# Patient Record
Sex: Male | Born: 1980 | Race: White | Hispanic: No | Marital: Married | Smoking: Former smoker
Health system: Southern US, Community
[De-identification: ages and names within clinical notes are randomized; demographics above are authoritative.]

## PROBLEM LIST (undated history)

## (undated) DIAGNOSIS — N200 Calculus of kidney: Secondary | ICD-10-CM

## (undated) DIAGNOSIS — M51379 Other intervertebral disc degeneration, lumbosacral region without mention of lumbar back pain or lower extremity pain: Secondary | ICD-10-CM

## (undated) DIAGNOSIS — N201 Calculus of ureter: Secondary | ICD-10-CM

## (undated) DIAGNOSIS — Z87442 Personal history of urinary calculi: Secondary | ICD-10-CM

## (undated) DIAGNOSIS — M5137 Other intervertebral disc degeneration, lumbosacral region: Secondary | ICD-10-CM

## (undated) DIAGNOSIS — K219 Gastro-esophageal reflux disease without esophagitis: Secondary | ICD-10-CM

## (undated) HISTORY — PX: TONSILLECTOMY: SUR1361

## (undated) HISTORY — DX: Gastro-esophageal reflux disease without esophagitis: K21.9

## (undated) HISTORY — PX: ESOPHAGEAL DILATION: SHX303

## (undated) HISTORY — DX: Personal history of urinary calculi: Z87.442

---

## 2002-09-03 ENCOUNTER — Emergency Department (HOSPITAL_COMMUNITY): Admission: EM | Admit: 2002-09-03 | Discharge: 2002-09-03 | Payer: Self-pay | Admitting: Emergency Medicine

## 2002-09-03 ENCOUNTER — Encounter: Payer: Self-pay | Admitting: *Deleted

## 2006-11-30 ENCOUNTER — Emergency Department (HOSPITAL_COMMUNITY): Admission: EM | Admit: 2006-11-30 | Discharge: 2006-11-30 | Payer: Self-pay | Admitting: Emergency Medicine

## 2007-05-08 ENCOUNTER — Emergency Department (HOSPITAL_COMMUNITY): Admission: EM | Admit: 2007-05-08 | Discharge: 2007-05-08 | Payer: Self-pay | Admitting: Emergency Medicine

## 2008-04-13 ENCOUNTER — Emergency Department (HOSPITAL_BASED_OUTPATIENT_CLINIC_OR_DEPARTMENT_OTHER): Admission: EM | Admit: 2008-04-13 | Discharge: 2008-04-13 | Payer: Self-pay | Admitting: Emergency Medicine

## 2008-05-22 ENCOUNTER — Emergency Department (HOSPITAL_BASED_OUTPATIENT_CLINIC_OR_DEPARTMENT_OTHER): Admission: EM | Admit: 2008-05-22 | Discharge: 2008-05-22 | Payer: Self-pay | Admitting: Emergency Medicine

## 2008-08-16 ENCOUNTER — Emergency Department (HOSPITAL_BASED_OUTPATIENT_CLINIC_OR_DEPARTMENT_OTHER): Admission: EM | Admit: 2008-08-16 | Discharge: 2008-08-16 | Payer: Self-pay | Admitting: Emergency Medicine

## 2008-08-16 ENCOUNTER — Ambulatory Visit: Payer: Self-pay | Admitting: Diagnostic Radiology

## 2010-02-19 ENCOUNTER — Emergency Department (HOSPITAL_BASED_OUTPATIENT_CLINIC_OR_DEPARTMENT_OTHER): Admission: EM | Admit: 2010-02-19 | Discharge: 2010-02-19 | Payer: Self-pay | Admitting: Emergency Medicine

## 2010-03-26 ENCOUNTER — Ambulatory Visit: Payer: Self-pay | Admitting: Family Medicine

## 2010-03-26 DIAGNOSIS — M549 Dorsalgia, unspecified: Secondary | ICD-10-CM | POA: Insufficient documentation

## 2010-03-26 DIAGNOSIS — Z87442 Personal history of urinary calculi: Secondary | ICD-10-CM | POA: Insufficient documentation

## 2010-03-26 DIAGNOSIS — K219 Gastro-esophageal reflux disease without esophagitis: Secondary | ICD-10-CM | POA: Insufficient documentation

## 2010-07-16 ENCOUNTER — Ambulatory Visit: Payer: Self-pay | Admitting: Family Medicine

## 2010-08-31 NOTE — Assessment & Plan Note (Signed)
Summary: new to estab/cbs   Vital Signs:  Patient profile:   30 year old male Height:      71 inches Weight:      325 pounds BMI:     45.49 Pulse rate:   92 / minute BP sitting:   120 / 80  (left arm)  Vitals Entered By: Doristine Devoid CMA (March 26, 2010 10:08 AM) CC: NEW EST- lower back pain on R side travels down leg now on both sides    History of Present Illness: 30 yo man here today to establish care.  previous MD- [facility]).  LBP- sxs started 'a few months ago'.  sxs were initially intermittant but now more frequent.  R sided 'next to the spine above the buttock'.  will radiate down the back of the thigh.  will hurt after playing softball.  now having some pain on L.  extension is worse than flexion- will have 'an unbearable sharp pain'.  no numbness or tingling, no weakness.  no incontinence.  does a lot of bending and lifting at work.  went to [facility] a couple weeks ago and was started on Vicodin and Flexeril for 'a pulled muscle'.  Preventive Screening-Counseling & Management  Alcohol-Tobacco     Alcohol drinks/day: <1     Smoking Status: quit < 6 months     Year Quit: 2011  Caffeine-Diet-Exercise     Does Patient Exercise: no      Sexual History:  currently monogamous.        Drug Use:  never.    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  obese Msk:  + TTP over lumbar paraspinal muscles, R>L (-) SLR pain w/ extension>flexion Pulses:  +2 radial, DP/PT Extremities:  no C/C/E Neurologic:  alert & oriented X3, cranial nerves II-XII intact, sensation intact to light touch, gait normal, and DTRs symmetrical and normal.     Impression & Recommendations:  Problem # 1:  BACK PAIN (ICD-724.5) Assessment New most likely paraspinal strain.  start scheduled NSAIDs and muscle relaxer.  no red flags on hx or PE.  strongly encouraged weight loss.  reviewed supportive care and red flags that should prompt return.  Pt expresses understanding and is in agreement w/ this plan. His updated medication list for this problem includes:    Cyclobenzaprine Hcl 10 Mg Tabs (Cyclobenzaprine hcl) .Marland Kitchen... 1 by mouth 2 times daily as needed for back pain    Naproxen 500 Mg Tabs (Naproxen) .Marland Kitchen... 1 tab by mouth two times a day x7-10 days and then as needed for pain.  take w/ food.  Complete Medication List: 1)  Cyclobenzaprine Hcl 10 Mg Tabs (Cyclobenzaprine hcl) .Marland Kitchen.. 1 by mouth 2 times daily as needed for back pain 2)  Naproxen 500 Mg Tabs (Naproxen) .Marland Kitchen.. 1 tab by mouth two times a day x7-10 days and  then as needed for pain.  take w/ food.  Patient Instructions: 1)  Please schedule your complete physical at your convenience- do not eat before this appt 2)  Take the Naproxen as directed- take w/ food to avoid upset stomach 3)  Use the muscle relaxer before bed- this will cause drowsiness 4)  Use a heating pad as needed for pain and stiffness 5)  Listen to your body- if it hurts, ease off- but it's ok to exercise 6)  Welcome!  We're glad to have you!!!! Prescriptions: NAPROXEN 500 MG TABS (NAPROXEN) 1 tab by mouth two times a day x7-10 days and then as needed for pain.  take w/ food.  #60 x 0   Entered and  Authorized by:   Neena Rhymes MD   Signed by:   Neena Rhymes MD on 03/26/2010   Method used:   Electronically to        [facility] # [phone]* (retail)       [address]       [place], [place]  [address]       Ph: [phone]       Fax: [phone]   RxID:   [id number] CYCLOBENZAPRINE HCL 10 MG  TABS (CYCLOBENZAPRINE HCL) 1 by mouth 2 times daily as needed for back pain  #30 x 0   Entered and Authorized by:   Neena Rhymes MD   Signed by:   Neena Rhymes MD on 03/26/2010   Method used:   Electronically to        [facility] # [phone]* (retail)       [address]       [place], [place]  [address]       Ph: [phone]       Fax: [phone]   RxID:   [id number]

## 2010-09-02 NOTE — Assessment & Plan Note (Signed)
Summary: back pain/cbs   Vital Signs:  Patient profile:   30 year old male Weight:      322 pounds BMI:     45.07 Pulse rate:   76 / minute BP sitting:   110 / 80  (left arm)  Vitals Entered By: Doristine Devoid CMA (July 16, 2010 8:48 AM) CC: still w/ back pain    History of Present Illness: 30 yo man here today for continued back pain.  pain never improved after August visit.  temporary relief w/ Naproxen and muscle relaxers.  after meds would wear off pain would return.  pain now worse than previous.  pain is lumbar, bilateral.  pain radiating into butt and legs.  no numbness or tingling of legs but will have some leg weakness.  pain worsens w/ movement.  Current Medications (verified): 1)  Cyclobenzaprine Hcl 10 Mg  Tabs (Cyclobenzaprine Hcl) .Marland Kitchen.. 1 By Mouth 2 Times Daily As Needed For Back Pain 2)  Naproxen 500 Mg Tabs (Naproxen) .Marland Kitchen.. 1 Tab By Mouth Two Times A Day X7-10 Days and Then As Needed For Pain.  Take W/ Food.  Allergies (verified): No Known Drug Allergies  Review of Systems      See HPI  Physical Exam  General:  Well-developed,well-nourished,in no acute distress; alert,appropriate and cooperative throughout examination.  obese Msk:  + TTP over lumbar paraspinal muscles and SI joints bilaterally (-) SLR pain w/ extension>flexion Pulses:  +2 radial, DP/PT Extremities:  no C/C/E Neurologic:  alert & oriented X3, cranial nerves II-XII intact, sensation intact to light touch, gait normal, and DTRs symmetrical and normal.     Impression & Recommendations:  Problem # 1:  BACK PAIN (ICD-724.5) Assessment Deteriorated pt's pain has worsened over the last 3 months rather than improved.  restart NSAIDs and muscle relaxers until ortho appt. His updated medication list for this problem includes:    Cyclobenzaprine Hcl 10 Mg Tabs (Cyclobenzaprine hcl) .Marland Kitchen... 1 by mouth 2 times daily as needed for back pain    Naproxen 500 Mg Tabs (Naproxen) .Marland Kitchen... 1 tab by mouth two  times a day x7-10 days and then as needed for pain.  take w/ food.  Orders: Orthopedic Referral (Ortho)  Complete Medication List: 1)  Cyclobenzaprine Hcl 10 Mg Tabs (Cyclobenzaprine hcl) .Marland Kitchen.. 1 by mouth 2 times daily as needed for back pain 2)  Naproxen 500 Mg Tabs (Naproxen) .Marland Kitchen.. 1 tab by mouth two times a day x7-10 days and then as needed for pain.  take w/ food.  Patient Instructions: 1)  Someone will call you with your ortho appt 2)  Restart the Naproxen for pain and inflammation 3)  Use the Cyclobenzaprine (muscle relaxer) before bed 4)  Heating pad for pain relief 5)  Hang in there!!! 6)  Merry Christmas!! Prescriptions: NAPROXEN 500 MG TABS (NAPROXEN) 1 tab by mouth two times a day x7-10 days and then as needed for pain.  take w/ food.  #60 x 0   Entered and Authorized by:   Neena Rhymes MD   Signed by:   Neena Rhymes MD on 07/16/2010   Method used:   Electronically to        [facility] # [phone]* (retail)       [address]       [place], [place]  [address]       Ph: [phone]       Fax: [phone]  RxID:   [id number] CYCLOBENZAPRINE HCL 10 MG  TABS (CYCLOBENZAPRINE HCL) 1 by mouth 2 times daily as needed for back pain  #30 x 0   Entered and Authorized by:   Neena Rhymes MD   Signed by:   Neena Rhymes MD on 07/16/2010   Method used:   Electronically to        [facility] # [phone]* (retail)       [address]       [place], [place]  [address]       Ph: [phone]       Fax: [phone]   RxID:   [id number]    Orders Added: 1)  Orthopedic Referral [Ortho] 2)  Est. Patient Level III [34742]

## 2011-05-12 LAB — URINE MICROSCOPIC-ADD ON

## 2011-05-12 LAB — URINALYSIS, ROUTINE W REFLEX MICROSCOPIC
Bilirubin Urine: NEGATIVE
Specific Gravity, Urine: 1.02
pH: 6

## 2011-05-19 ENCOUNTER — Encounter: Payer: Self-pay | Admitting: Family Medicine

## 2011-05-20 ENCOUNTER — Ambulatory Visit: Payer: Self-pay | Admitting: Family Medicine

## 2011-07-28 ENCOUNTER — Encounter: Payer: Self-pay | Admitting: Family Medicine

## 2011-07-28 ENCOUNTER — Ambulatory Visit (INDEPENDENT_AMBULATORY_CARE_PROVIDER_SITE_OTHER): Payer: 59 | Admitting: Family Medicine

## 2011-07-28 VITALS — BP 120/70 | HR 79 | Temp 98.9°F | Ht 69.75 in | Wt 338.6 lb

## 2011-07-28 DIAGNOSIS — J069 Acute upper respiratory infection, unspecified: Secondary | ICD-10-CM | POA: Insufficient documentation

## 2011-07-28 MED ORDER — BENZONATATE 200 MG PO CAPS: 200.0000 mg | ORAL_CAPSULE | Freq: Three times a day (TID) | ORAL | Status: AC | PRN

## 2011-07-28 MED ORDER — ALBUTEROL SULFATE HFA 108 (90 BASE) MCG/ACT IN AERS: 2.0000 | INHALATION_SPRAY | Freq: Four times a day (QID) | RESPIRATORY_TRACT | Status: DC | PRN

## 2011-07-28 MED ORDER — GUAIFENESIN-CODEINE 100-10 MG/5ML PO SYRP: 5.0000 mL | ORAL_SOLUTION | Freq: Three times a day (TID) | ORAL | Status: AC | PRN

## 2011-07-28 NOTE — Assessment & Plan Note (Signed)
Pt's sxs are improving but cough persisting.  No evidence of bacterial infxn on PE- most likely viral.  Start cough meds, mucinex, albuterol inhaler prn.  Reviewed supportive care and red flags that should prompt return.  Pt expressed understanding and is in agreement w/ plan.

## 2011-07-28 NOTE — Patient Instructions (Signed)
This is most likely a virus and should improve w/ time Use the cough pills for daytime and syrup for night Add Mucinex to thin your congestion Use the inhaler as needed for coughing fit REST! Drink plenty of fluids Sudafed for your nasal congestion Call with any questions or concerns Happy New Year!

## 2011-07-28 NOTE — Progress Notes (Signed)
  Subjective:    Patient ID: Jack Peterson, male    DOB: 28-Sep-1980, 30 y.o.   MRN: [id number]  HPI URI- sxs started Saturday.  + nasal/chest congestion.  Cough is intermittently productive.  Denies facial pain, sore throat, ear pain.  No known sick contacts.  Initially had body aches but this improved.  Fever on Sunday 100.8 but none since.   Review of Systems For ROS see HPI     Objective:   Physical Exam  Vitals reviewed. Constitutional: He appears well-developed and well-nourished. No distress.  HENT:  Head: Normocephalic and atraumatic.  Right Ear: Tympanic membrane normal.  Left Ear: Tympanic membrane normal.  Nose: No mucosal edema or rhinorrhea. Right sinus exhibits no maxillary sinus tenderness and no frontal sinus tenderness. Left sinus exhibits no maxillary sinus tenderness and no frontal sinus tenderness.  Mouth/Throat: Mucous membranes are normal. No oropharyngeal exudate, posterior oropharyngeal edema or posterior oropharyngeal erythema.  Eyes: Conjunctivae and EOM are normal. Pupils are equal, round, and reactive to light.  Neck: Normal range of motion. Neck supple.  Cardiovascular: Normal rate, regular rhythm and normal heart sounds.   Pulmonary/Chest: Effort normal and breath sounds normal. No respiratory distress. He has no wheezes.       + hacking cough  Lymphadenopathy:    He has no cervical adenopathy.  Skin: Skin is warm and dry.          Assessment & Plan:

## 2011-09-09 ENCOUNTER — Ambulatory Visit (INDEPENDENT_AMBULATORY_CARE_PROVIDER_SITE_OTHER): Payer: 59 | Admitting: Family Medicine

## 2011-09-09 VITALS — BP 130/90 | HR 61 | Temp 97.5°F | Ht 72.0 in | Wt 343.0 lb

## 2011-09-09 DIAGNOSIS — J329 Chronic sinusitis, unspecified: Secondary | ICD-10-CM

## 2011-09-09 MED ORDER — AMOXICILLIN 875 MG PO TABS: 875.0000 mg | ORAL_TABLET | Freq: Two times a day (BID) | ORAL | Status: AC

## 2011-09-09 NOTE — Patient Instructions (Signed)
This is consistent w/ a sinus infection Start the Amoxicillin twice daily w/ food for 10 days Start your Claritin or Zyrtec daily Drink plenty of fluids If this recurs in the future, start a sinus decongestant Call if no improvement Hang in there!!!

## 2011-09-09 NOTE — Assessment & Plan Note (Signed)
Pt reports recurrent sxs in same R frontal sinus area.  tx today w/ abx.  Start daily allergy meds.  May need referral to ENT if sxs persist.  Reviewed supportive care and red flags that should prompt return.  Pt expressed understanding and is in agreement w/ plan.

## 2011-09-09 NOTE — Progress Notes (Signed)
  Subjective:    Patient ID: Jack Peterson, male    DOB: 05-Apr-1981, 31 y.o.   MRN: [id number]  HPI HA- sxs started 'a couple days ago'.  Reports hx of similar over the last few years.  Has taken tylenol and ibuprofen w/out relief.  Hot shower will improve sxs.  Pain will be intermittent throughout the day.  Will have mild intermittent nausea.  No sensitivity to light or sound.  No dizziness.  HA is 'always in the same place', above R eye and top of head on R.  No aura or visual changes.  No numbness or weakness.   Review of Systems For ROS see HPI     Objective:   Physical Exam  Vitals reviewed. Constitutional: He appears well-developed and well-nourished. No distress.  HENT:  Head: Normocephalic and atraumatic.  Right Ear: Tympanic membrane normal.  Left Ear: Tympanic membrane normal.  Nose: Mucosal edema and rhinorrhea present. Right sinus exhibits frontal sinus tenderness. Right sinus exhibits no maxillary sinus tenderness. Left sinus exhibits no maxillary sinus tenderness and no frontal sinus tenderness.  Mouth/Throat: Mucous membranes are normal. Oropharyngeal exudate and posterior oropharyngeal erythema present. No posterior oropharyngeal edema.       + PND  Eyes: Conjunctivae and EOM are normal. Pupils are equal, round, and reactive to light.  Neck: Normal range of motion. Neck supple.  Cardiovascular: Normal rate, regular rhythm and normal heart sounds.   Pulmonary/Chest: Effort normal and breath sounds normal. No respiratory distress. He has no wheezes.       + hacking cough  Lymphadenopathy:    He has no cervical adenopathy.  Skin: Skin is warm and dry.          Assessment & Plan:

## 2012-04-10 ENCOUNTER — Ambulatory Visit (HOSPITAL_BASED_OUTPATIENT_CLINIC_OR_DEPARTMENT_OTHER)
Admission: RE | Admit: 2012-04-10 | Discharge: 2012-04-10 | Disposition: A | Discharge status: Home / Self Care | Payer: 59 | Source: Ambulatory Visit | Attending: Family Medicine | Admitting: Family Medicine

## 2012-04-10 ENCOUNTER — Encounter: Payer: Self-pay | Admitting: Family Medicine

## 2012-04-10 ENCOUNTER — Ambulatory Visit (INDEPENDENT_AMBULATORY_CARE_PROVIDER_SITE_OTHER): Payer: 59 | Admitting: Family Medicine

## 2012-04-10 VITALS — BP 118/74 | HR 70 | Temp 98.4°F | Wt 279.0 lb

## 2012-04-10 DIAGNOSIS — R109 Unspecified abdominal pain: Secondary | ICD-10-CM | POA: Insufficient documentation

## 2012-04-10 DIAGNOSIS — R350 Frequency of micturition: Secondary | ICD-10-CM

## 2012-04-10 DIAGNOSIS — R319 Hematuria, unspecified: Secondary | ICD-10-CM

## 2012-04-10 DIAGNOSIS — K7689 Other specified diseases of liver: Secondary | ICD-10-CM | POA: Insufficient documentation

## 2012-04-10 DIAGNOSIS — N200 Calculus of kidney: Secondary | ICD-10-CM | POA: Insufficient documentation

## 2012-04-10 LAB — POCT URINALYSIS DIPSTICK
Glucose, UA: NEGATIVE
Leukocytes, UA: NEGATIVE
Nitrite, UA: NEGATIVE
Urobilinogen, UA: 0.2

## 2012-04-10 MED ORDER — CIPROFLOXACIN HCL 500 MG PO TABS: 500.0000 mg | ORAL_TABLET | Freq: Two times a day (BID) | ORAL | Status: AC

## 2012-04-10 MED ORDER — HYDROCODONE-ACETAMINOPHEN 5-500 MG PO TABS: 1.0000 | ORAL_TABLET | Freq: Four times a day (QID) | ORAL | Status: AC | PRN

## 2012-04-10 NOTE — Patient Instructions (Signed)
Ureteral Colic (Kidney Stones) Ureteral colic is the result of a condition when kidney stones form inside the kidney. Once kidney stones are formed they may move into the tube that connects the kidney with the bladder (ureter). If this occurs, this condition may cause pain (colic) in the ureter.  CAUSES  Pain is caused by stone movement in the ureter and the obstruction caused by the stone. SYMPTOMS  The pain comes and goes as the ureter contracts around the stone. The pain is usually intense, sharp, and stabbing in character. The location of the pain may move as the stone moves through the ureter. When the stone is near the kidney the pain is usually located in the back and radiates to the belly (abdomen). When the stone is ready to pass into the bladder the pain is often located in the lower abdomen on the side the stone is located. At this location, the symptoms may mimic those of a urinary tract infection with urinary frequency. Once the stone is located here it often passes into the bladder and the pain disappears completely. TREATMENT   Your caregiver will provide you with medicine for pain relief.   You may require specialized follow-up X-rays.   The absence of pain does not always mean that the stone has passed. It may have just stopped moving. If the urine remains completely obstructed, it can cause loss of kidney function or even complete destruction of the involved kidney. It is your responsibility and in your interest that X-rays and follow-ups as suggested by your caregiver are completed. Relief of pain without passage of the stone can be associated with severe damage to the kidney, including loss of kidney function on that side.   If your stone does not pass on its own, additional measures may be taken by your caregiver to ensure its removal.  HOME CARE INSTRUCTIONS   Increase your fluid intake. Water is the preferred fluid since juices containing vitamin C may acidify the urine making  it less likely for certain stones (uric acid stones) to pass.   Strain all urine. A strainer will be provided. Keep all particulate matter or stones for your caregiver to inspect.   Take your pain medicine as directed.   Make a follow-up appointment with your caregiver as directed.   Remember that the goal is passage of your stone. The absence of pain does not mean the stone is gone. Follow your caregiver's instructions.   Only take over-the-counter or prescription medicines for pain, discomfort, or fever as directed by your caregiver.  SEEK MEDICAL CARE IF:   Pain cannot be controlled with the prescribed medicine.   You have a fever.   Pain continues for longer than your caregiver advises it should.   There is a change in the pain, and you develop chest discomfort or constant abdominal pain.   You feel faint or pass out.  MAKE SURE YOU:   Understand these instructions.   Will watch your condition.   Will get help right away if you are not doing well or get worse.  Document Released: 04/27/2005 Document Revised: 07/07/2011 Document Reviewed: 01/12/2011 Exit[employer] Patient Information 2012 [employer]. 

## 2012-04-10 NOTE — Progress Notes (Signed)
  Subjective:    Patient ID: Jack Peterson, male    DOB: [birth year], 31 y.o.   MRN: [id number]  HPIpt here with hx kidney stones c/o L flank pain and urinary frequency for last few days.  No fever, no dysuria, no penile d/c.   Review of Systems   as above Objective:   Physical Exam  Constitutional: He is oriented to person, place, and time. He appears well-developed and well-nourished.  Abdominal: Soft. He exhibits no distension. There is no tenderness. There is no rebound and no guarding.  Musculoskeletal:       No flank pain with palpation  Neurological: He is alert and oriented to person, place, and time.  Psychiatric: He has a normal mood and affect. His behavior is normal.          Assessment & Plan:

## 2012-04-10 NOTE — Assessment & Plan Note (Signed)
Send urine for culture Ct abd /pelvis Strain urine vicodin for pain cipro

## 2012-04-12 LAB — URINE CULTURE
Colony Count: NO GROWTH
Organism ID, Bacteria: NO GROWTH

## 2012-06-21 ENCOUNTER — Telehealth: Payer: Self-pay | Admitting: Family Medicine

## 2012-06-21 NOTE — Telephone Encounter (Signed)
Please clarify if this is for back pain or kidney stone.  Ok for #30, no refills  Please remind him to schedule CPE

## 2012-06-21 NOTE — Telephone Encounter (Signed)
Pt requesting rx for hydrocodone. He states Dr. Beverely Low knows why he needs it. [facility] [address] in [place].

## 2012-06-21 NOTE — Telephone Encounter (Signed)
04-10-12 Last Ov, last filled #30

## 2012-06-22 MED ORDER — HYDROCODONE-ACETAMINOPHEN 5-500 MG PO TABS: 1.0000 | ORAL_TABLET | Freq: Four times a day (QID) | ORAL | Status: DC | PRN

## 2012-06-22 NOTE — Telephone Encounter (Signed)
Pt indicated that med is for back Pt. Pt was sent to ortho and Dx with degenerate disc disease. Appt schedule, Rx sent

## 2012-06-22 NOTE — Telephone Encounter (Signed)
Left message to call office

## 2012-08-22 ENCOUNTER — Encounter: Payer: Self-pay | Admitting: Lab

## 2012-08-23 ENCOUNTER — Encounter: Payer: Self-pay | Admitting: Family Medicine

## 2012-08-23 ENCOUNTER — Ambulatory Visit (INDEPENDENT_AMBULATORY_CARE_PROVIDER_SITE_OTHER): Payer: 59 | Admitting: Family Medicine

## 2012-08-23 VITALS — BP 120/70 | HR 78 | Temp 98.1°F | Wt 282.0 lb

## 2012-08-23 DIAGNOSIS — Z23 Encounter for immunization: Secondary | ICD-10-CM

## 2012-08-23 DIAGNOSIS — Z0000 Encounter for general adult medical examination without abnormal findings: Secondary | ICD-10-CM

## 2012-08-23 NOTE — Progress Notes (Signed)
  Subjective:    Patient ID: Jack Peterson, male    DOB: 10-10-1980, 32 y.o.   MRN: [id number]  HPI CPE- pt has lost 50+ lbs since last visit.  Exercising and eating healthy.   Review of Systems Patient reports no vision/hearing changes, anorexia, fever ,adenopathy, persistant/recurrent hoarseness, swallowing issues, chest pain, palpitations, edema, persistant/recurrent cough, hemoptysis, dyspnea (rest,exertional, paroxysmal nocturnal), gastrointestinal  bleeding (melena, rectal bleeding), abdominal pain, excessive heart burn, GU symptoms (dysuria, hematuria, voiding/incontinence issues) syncope, focal weakness, memory loss, numbness & tingling, skin/hair/nail changes, depression, anxiety, abnormal bruising/bleeding, musculoskeletal symptoms/signs.     Objective:   Physical Exam BP 120/70  Pulse 78  Temp 98.1 F (36.7 C) (Oral)  Wt 282 lb (127.914 kg)  SpO2 98%  General Appearance:    Alert, cooperative, no distress, appears stated age  Head:    Normocephalic, without obvious abnormality, atraumatic  Eyes:    PERRL, conjunctiva/corneas clear, EOM's intact, fundi    benign, both eyes       Ears:    Normal TM's and external ear canals, both ears  Nose:   Nares normal, septum midline, mucosa normal, no drainage   or sinus tenderness  Throat:   Lips, mucosa, and tongue normal; teeth and gums normal  Neck:   Supple, symmetrical, trachea midline, no adenopathy;       thyroid:  No enlargement/tenderness/nodules  Back:     Symmetric, no curvature, ROM normal, no CVA tenderness  Lungs:     Clear to auscultation bilaterally, respirations unlabored  Chest wall:    No tenderness or deformity  Heart:    Regular rate and rhythm, S1 and S2 normal, no murmur, rub   or gallop  Abdomen:     Soft, non-tender, bowel sounds active all four quadrants,    no masses, no organomegaly  Genitalia:    Normal male without lesion, discharge or tenderness  Rectal:    Deferred due to young age    Extremities:   Extremities normal, atraumatic, no cyanosis or edema  Pulses:   2+ and symmetric all extremities  Skin:   Skin color, texture, turgor normal, no rashes or lesions  Lymph nodes:   Cervical, supraclavicular, and axillary nodes normal  Neurologic:   CNII-XII intact. Normal strength, sensation and reflexes      throughout          Assessment & Plan:

## 2012-08-23 NOTE — Patient Instructions (Addendum)
Follow up in 1 year or as needed We'll notify you of your lab results and make any changes if needed Keep up the good work on healthy diet and regular exercise Call with any questions or concerns Happy New Year!!! 

## 2012-08-23 NOTE — Assessment & Plan Note (Signed)
Pt's PE WNL.  Check labs.  Tdap updated.  Applauded recent weight loss.  Anticipatory guidance provided.

## 2012-08-24 ENCOUNTER — Encounter: Payer: Self-pay | Admitting: *Deleted

## 2012-08-24 LAB — HEPATIC FUNCTION PANEL
Alkaline Phosphatase: 56 U/L (ref 39–117)
Bilirubin, Direct: 0.2 mg/dL (ref 0.0–0.3)
Total Protein: 7.3 g/dL (ref 6.0–8.3)

## 2012-08-24 LAB — BASIC METABOLIC PANEL
CO2: 29 mEq/L (ref 19–32)
Calcium: 9.1 mg/dL (ref 8.4–10.5)
Chloride: 102 mEq/L (ref 96–112)
Potassium: 3.9 mEq/L (ref 3.5–5.1)
Sodium: 138 mEq/L (ref 135–145)

## 2012-08-24 LAB — LIPID PANEL
LDL Cholesterol: 70 mg/dL (ref 0–99)
Total CHOL/HDL Ratio: 3
Triglycerides: 56 mg/dL (ref 0.0–149.0)

## 2012-08-24 LAB — CBC WITH DIFFERENTIAL/PLATELET
Basophils Absolute: 0 10*3/uL (ref 0.0–0.1)
Lymphocytes Relative: 22.8 % (ref 12.0–46.0)
Lymphs Abs: 1.8 10*3/uL (ref 0.7–4.0)
Monocytes Relative: 6.6 % (ref 3.0–12.0)
Neutrophils Relative %: 68.8 % (ref 43.0–77.0)
Platelets: 223 10*3/uL (ref 150.0–400.0)
RDW: 12.3 % (ref 11.5–14.6)

## 2012-08-31 ENCOUNTER — Telehealth: Payer: Self-pay | Admitting: *Deleted

## 2012-08-31 NOTE — Telephone Encounter (Signed)
Pt left VM requesting results of labs. Left message to call office    Notes Recorded by Sheliah Hatch, MD on 08/24/2012 at 12:44 PM Labs look great! No changes

## 2012-09-17 ENCOUNTER — Encounter: Payer: Self-pay | Admitting: Family Medicine

## 2013-05-15 ENCOUNTER — Ambulatory Visit (INDEPENDENT_AMBULATORY_CARE_PROVIDER_SITE_OTHER): Payer: 59 | Admitting: Internal Medicine

## 2013-05-15 ENCOUNTER — Encounter: Payer: Self-pay | Admitting: Internal Medicine

## 2013-05-15 VITALS — BP 118/72 | HR 67 | Temp 98.7°F | Wt 300.1 lb

## 2013-05-15 DIAGNOSIS — R05 Cough: Secondary | ICD-10-CM

## 2013-05-15 DIAGNOSIS — R059 Cough, unspecified: Secondary | ICD-10-CM

## 2013-05-15 MED ORDER — GUAIFENESIN-CODEINE 100-10 MG/5ML PO SOLN
5.0000 mL | Freq: Every evening | ORAL | Status: DC | PRN
Start: 2013-05-15 — End: 2014-09-12

## 2013-05-15 MED ORDER — AZITHROMYCIN 250 MG PO TABS: ORAL_TABLET | ORAL | Status: DC

## 2013-05-15 NOTE — Progress Notes (Signed)
  Subjective:    Patient ID: Jack Peterson, male    DOB: 1981-04-01, 32 y.o.   MRN: [id number]  HPI Acute visit, Complaining of cough for the last 10 days, mostly dry, occasionally sees a small amount of sputum. Sometimes has difficulty sleeping due to cough.  Past Medical History  Diagnosis Date  . GERD (gastroesophageal reflux disease)   . History of nephrolithiasis    Past Surgical History  Procedure Laterality Date  . Tonsillectomy     History  Substance Use Topics  . Smoking status: Never Smoker   . Smokeless tobacco: Not on file  . Alcohol Use: Yes    Review of Systems + Subjective fever, no chills. Mild aches. No nausea vomiting, had diarrhea twice. No chest pain, shortness or breath or chest congestion. Minimal sinus discomfort.     Objective:   Physical Exam BP 118/72  Pulse 67  Temp(Src) 98.7 F (37.1 C)  Wt 300 lb 1.6 oz (136.124 kg)  BMI 40.69 kg/m2  SpO2 99%  General -- alert, well-developed, NAD.   HEENT-- Not pale. TMs slt bulge, pink B,  throat symmetric, no redness or discharge. Face symmetric, sinuses not tender to palpation. Nose slt congested.  Lungs -- normal respiratory effort, no intercostal retractions, no accessory muscle use, and normal breath sounds. Frequent cough noted. Heart-- normal rate, regular rhythm, no murmur.  Extremities-- no pretibial edema bilaterally  Neurologic--  alert & oriented X3. Speech normal, gait normal, strength normal in all extremities.       Assessment & Plan:   Cough, cough for 10 days, subjective fever, atypical bronchitis?. See instructions.

## 2013-05-15 NOTE — Patient Instructions (Signed)
Rest, fluids , tylenol For cough, take Mucinex DM twice a day as needed (or robitussin + delsym) If cough is persistent , take codeine at bedtime (don't mix w/ vicodin) Take the antibiotic as prescribed  (zithromax) Call if no better in few days Call anytime if the symptoms are severe

## 2014-08-01 HISTORY — PX: LITHOTRIPSY: SUR834

## 2014-09-12 ENCOUNTER — Encounter: Payer: Self-pay | Admitting: Family Medicine

## 2014-09-12 ENCOUNTER — Ambulatory Visit (INDEPENDENT_AMBULATORY_CARE_PROVIDER_SITE_OTHER): Payer: 59 | Admitting: Family Medicine

## 2014-09-12 VITALS — BP 124/82 | HR 80 | Temp 98.0°F | Resp 16 | Wt 304.2 lb

## 2014-09-12 DIAGNOSIS — K625 Hemorrhage of anus and rectum: Secondary | ICD-10-CM

## 2014-09-12 NOTE — Patient Instructions (Signed)
Follow up as needed We'll notify you of your lab results and make any changes if needed This is most likely a fissure Start OTC stool softener (like Colace) twice daily Drink plenty of fluids Continue healthy diet and regular exercise If no improvement in the next 7-10 days, please call me so we can send you to GI (most of these will heal on their own) Call with any questions or concerns Hang in there!!!

## 2014-09-12 NOTE — Progress Notes (Signed)
Pre visit review using our clinic review tool, if applicable. No additional management support is needed unless otherwise documented below in the visit note. 

## 2014-09-12 NOTE — Progress Notes (Signed)
   Subjective:    Patient ID: Jack Peterson, male    DOB: 03/20/1981, 34 y.o.   MRN: [id number]  HPI Blood in stool- pt reports stools are 'really large' and 'tough to pass'.  sxs started 'a couple of weeks ago'.  Today had 'a large amount of blood'.  Some pain- 'stinging, like a cut'.   Review of Systems For ROS see HPI     Objective:   Physical Exam  Constitutional: He is oriented to person, place, and time. He appears well-developed and well-nourished. No distress.  obese  Genitourinary: Rectal exam shows no external hemorrhoid, no internal hemorrhoid, no fissure (no obvious fissure), no mass, no tenderness and anal tone normal. Guaiac positive stool.  Neurological: He is alert and oriented to person, place, and time.  Skin: Skin is warm and dry.  Psychiatric: He has a normal mood and affect. His behavior is normal. Thought content normal.  Vitals reviewed.         Assessment & Plan:

## 2014-09-14 NOTE — Assessment & Plan Note (Signed)
New.  Based on pt's hx, suspect this is a fissure despite not being able to see one on PE.  Pt w/ heme + stool in office.  Strongly encouraged stool softeners, increased fluid intake, regular exercise, high fiber diet.  If pt continues to have bleeding, he is to call for GI referral.  Pt expressed understanding and is in agreement w/ plan.

## 2014-09-17 ENCOUNTER — Telehealth: Payer: Self-pay | Admitting: General Practice

## 2014-09-17 NOTE — Telephone Encounter (Signed)
Pt notified and will be in on Friday to have labs drawn.

## 2014-09-17 NOTE — Telephone Encounter (Signed)
-----   Message from Sheliah HatchKatherine E Tabori, MD sent at 09/17/2014  8:03 AM EST ----- Pt left prior to getting labs drawn.  Please see if he intends to have this done or if the bleeding has stopped.  Thanks!  ----- Message -----    From: SYSTEM    Sent: 09/17/2014  12:04 AM      To: Sheliah HatchKatherine E Tabori, MD

## 2014-09-17 NOTE — Telephone Encounter (Signed)
Called pt and lmovm to either call office and schedule lab appt or to notify if problem is still persistent.

## 2014-09-19 ENCOUNTER — Other Ambulatory Visit (INDEPENDENT_AMBULATORY_CARE_PROVIDER_SITE_OTHER): Payer: 59

## 2014-09-19 ENCOUNTER — Encounter: Payer: Self-pay | Admitting: General Practice

## 2014-09-19 DIAGNOSIS — K625 Hemorrhage of anus and rectum: Secondary | ICD-10-CM

## 2014-09-19 LAB — CBC WITH DIFFERENTIAL/PLATELET
BASOS ABS: 0 10*3/uL (ref 0.0–0.1)
Basophils Relative: 0.8 % (ref 0.0–3.0)
Eosinophils Absolute: 0.1 10*3/uL (ref 0.0–0.7)
Eosinophils Relative: 2.4 % (ref 0.0–5.0)
HEMATOCRIT: 43.6 % (ref 39.0–52.0)
HEMOGLOBIN: 15.2 g/dL (ref 13.0–17.0)
LYMPHS ABS: 1.6 10*3/uL (ref 0.7–4.0)
Lymphocytes Relative: 29.5 % (ref 12.0–46.0)
MCHC: 34.8 g/dL (ref 30.0–36.0)
MCV: 85.3 fl (ref 78.0–100.0)
MONO ABS: 0.5 10*3/uL (ref 0.1–1.0)
MONOS PCT: 9.6 % (ref 3.0–12.0)
NEUTROS ABS: 3.1 10*3/uL (ref 1.4–7.7)
Neutrophils Relative %: 57.7 % (ref 43.0–77.0)
Platelets: 215 10*3/uL (ref 150.0–400.0)
RBC: 5.11 Mil/uL (ref 4.22–5.81)
RDW: 12.7 % (ref 11.5–15.5)
WBC: 5.4 10*3/uL (ref 4.0–10.5)

## 2015-06-16 ENCOUNTER — Ambulatory Visit (INDEPENDENT_AMBULATORY_CARE_PROVIDER_SITE_OTHER): Payer: 59 | Admitting: Physician Assistant

## 2015-06-16 ENCOUNTER — Encounter: Payer: Self-pay | Admitting: Physician Assistant

## 2015-06-16 VITALS — BP 130/74 | HR 78 | Temp 98.1°F | Resp 18 | Ht 72.0 in | Wt 277.0 lb

## 2015-06-16 DIAGNOSIS — J208 Acute bronchitis due to other specified organisms: Principal | ICD-10-CM

## 2015-06-16 DIAGNOSIS — B9689 Other specified bacterial agents as the cause of diseases classified elsewhere: Secondary | ICD-10-CM

## 2015-06-16 DIAGNOSIS — J00 Acute nasopharyngitis [common cold]: Secondary | ICD-10-CM

## 2015-06-16 DIAGNOSIS — H6692 Otitis media, unspecified, left ear: Secondary | ICD-10-CM | POA: Diagnosis not present

## 2015-06-16 DIAGNOSIS — H6690 Otitis media, unspecified, unspecified ear: Secondary | ICD-10-CM | POA: Insufficient documentation

## 2015-06-16 MED ORDER — AZITHROMYCIN 250 MG PO TABS: ORAL_TABLET | ORAL | Status: DC

## 2015-06-16 MED ORDER — BENZONATATE 100 MG PO CAPS: 100.0000 mg | ORAL_CAPSULE | Freq: Two times a day (BID) | ORAL | Status: DC | PRN

## 2015-06-16 NOTE — Progress Notes (Signed)
Pre visit review using our clinic review tool, if applicable. No additional management support is needed unless otherwise documented below in the visit note/SLS  

## 2015-06-16 NOTE — Assessment & Plan Note (Signed)
Rx Azithromycin.  Increase fluids.  Rest.  Saline nasal spray.  Probiotic.  Mucinex as directed.  Humidifier in bedroom. Flonase.  Call or return to clinic if symptoms are not improving.

## 2015-06-16 NOTE — Progress Notes (Signed)
   Patient presents to clinic today c/o 1.5 weeks of chest congestion that is sometimes productive of colored sputum. Denies fever, chills, headache. Endorses mild PND and bilateral ear pain x 1 day. Denies recent travel. Denies sick contact. Denies history asthma or COPD. Is not a smoker.  Past Medical History  Diagnosis Date  . GERD (gastroesophageal reflux disease)   . History of nephrolithiasis     No current outpatient prescriptions on file prior to visit.   No current facility-administered medications on file prior to visit.    No Known Allergies  Family History  Problem Relation Age of Onset  . Hypertension Father   . Hypertension Maternal Grandmother   . Cancer Paternal Grandfather     prostate  . Diabetes Neg Hx   . Stroke Neg Hx     Social History   Social History  . Marital Status: Married    Spouse Name: N/A  . Number of Children: 0  . Years of Education: N/A   Occupational History  . Chemical Operator    Social History Main Topics  . Smoking status: Never Smoker   . Smokeless tobacco: None  . Alcohol Use: Yes  . Drug Use: No  . Sexual Activity: Not Asked   Other Topics Concern  . None   Social History Narrative   Review of Systems - See HPI.  All other ROS are negative.  BP 130/74 mmHg  Pulse 78  Temp(Src) 98.1 F (36.7 C) (Oral)  Resp 18  Ht 6' (1.829 m)  Wt 277 lb (125.646 kg)  BMI 37.56 kg/m2  SpO2 98%  Physical Exam  Constitutional: He is oriented to person, place, and time and well-developed, well-nourished, and in no distress.  HENT:  Head: Normocephalic and atraumatic.  Right Ear: External ear normal.  Left Ear: External ear normal. Tympanic membrane is erythematous and bulging.  Nose: Nose normal.  Mouth/Throat: Uvula is midline and oropharynx is clear and moist. No oropharyngeal exudate.  Eyes: Conjunctivae are normal.  Neck: Neck supple.  Cardiovascular: Normal rate, regular rhythm, normal heart sounds and intact distal  pulses.   Pulmonary/Chest: Effort normal and breath sounds normal. No respiratory distress. He has no wheezes. He has no rales. He exhibits no tenderness.  Neurological: He is alert and oriented to person, place, and time.  Skin: Skin is warm and dry. No rash noted.  Psychiatric: Affect normal.  Vitals reviewed.   No results found for this or any previous visit (from the past 2160 hour(s)).  Assessment/Plan: Acute bacterial bronchitis Rx Azithromycin.  Increase fluids.  Rest.  Saline nasal spray.  Probiotic.  Mucinex as directed.  Humidifier in bedroom. Flonase.  Call or return to clinic if symptoms are not improving.

## 2015-06-16 NOTE — Patient Instructions (Signed)
Take antibiotic (Azithromycin) as directed.  Increase fluids.  Get plenty of rest. Use Mucinex for congestion. Use Tessalon for cough. Take a daily probiotic (I recommend Align or Culturelle, but even Activia Yogurt may be beneficial).  A humidifier placed in the bedroom may offer some relief for a dry, scratchy throat of nasal irritation.  Read information below on acute bronchitis. Please call or return to clinic if symptoms are not improving.  Acute Bronchitis Bronchitis is when the airways that extend from the windpipe into the lungs get red, puffy, and painful (inflamed). Bronchitis often causes thick spit (mucus) to develop. This leads to a cough. A cough is the most common symptom of bronchitis. In acute bronchitis, the condition usually begins suddenly and goes away over time (usually in 2 weeks). Smoking, allergies, and asthma can make bronchitis worse. Repeated episodes of bronchitis may cause more lung problems.  HOME CARE  Rest.  Drink enough fluids to keep your pee (urine) clear or pale yellow (unless you need to limit fluids as told by your doctor).  Only take over-the-counter or prescription medicines as told by your doctor.  Avoid smoking and secondhand smoke. These can make bronchitis worse. If you are a smoker, think about using nicotine gum or skin patches. Quitting smoking will help your lungs heal faster.  Reduce the chance of getting bronchitis again by:  Washing your hands often.  Avoiding people with cold symptoms.  Trying not to touch your hands to your mouth, nose, or eyes.  Follow up with your doctor as told.  GET HELP IF: Your symptoms do not improve after 1 week of treatment. Symptoms include:  Cough.  Fever.  Coughing up thick spit.  Body aches.  Chest congestion.  Chills.  Shortness of breath.  Sore throat.  GET HELP RIGHT AWAY IF:   You have an increased fever.  You have chills.  You have severe shortness of breath.  You have bloody  thick spit (sputum).  You throw up (vomit) often.  You lose too much body fluid (dehydration).  You have a severe headache.  You faint.  MAKE SURE YOU:   Understand these instructions.  Will watch your condition.  Will get help right away if you are not doing well or get worse. Document Released: 01/04/2008 Document Revised: 03/20/2013 Document Reviewed: 01/08/2013 [facility] Patient Information 2015 [place] [place]. This information is not intended to replace advice given to you by your health care provider. Make sure you discuss any questions you have with your health care provider.

## 2015-07-22 ENCOUNTER — Encounter: Payer: Self-pay | Admitting: Family Medicine

## 2015-07-22 ENCOUNTER — Ambulatory Visit (INDEPENDENT_AMBULATORY_CARE_PROVIDER_SITE_OTHER): Payer: 59 | Admitting: Family Medicine

## 2015-07-22 VITALS — BP 124/76 | HR 79 | Temp 97.9°F | Resp 16 | Ht 72.0 in | Wt 278.4 lb

## 2015-07-22 DIAGNOSIS — R05 Cough: Secondary | ICD-10-CM

## 2015-07-22 DIAGNOSIS — R059 Cough, unspecified: Secondary | ICD-10-CM

## 2015-07-22 MED ORDER — PROMETHAZINE-DM 6.25-15 MG/5ML PO SYRP: 5.0000 mL | ORAL_SOLUTION | Freq: Four times a day (QID) | ORAL | Status: DC | PRN

## 2015-07-22 MED ORDER — ALBUTEROL SULFATE (2.5 MG/3ML) 0.083% IN NEBU
2.5000 mg | INHALATION_SOLUTION | Freq: Once | RESPIRATORY_TRACT | Status: AC
  Administered 2015-07-22: 2.5 mg via RESPIRATORY_TRACT

## 2015-07-22 MED ORDER — ALBUTEROL SULFATE HFA 108 (90 BASE) MCG/ACT IN AERS: 2.0000 | INHALATION_SPRAY | Freq: Four times a day (QID) | RESPIRATORY_TRACT | Status: DC | PRN

## 2015-07-22 NOTE — Progress Notes (Signed)
Pre visit review using our clinic review tool, if applicable. No additional management support is needed unless otherwise documented below in the visit note. 

## 2015-07-22 NOTE — Progress Notes (Signed)
   Subjective:    Patient ID: Jack Peterson, male    DOB: 1981-07-27, 34 y.o.   MRN: [id number]  HPI URI- pt got sick 2nd week of November.  tx'd for bronchitis and 'started to get better but then it came back'.  No fevers.  + nasal congestion.  Cough is intermittently productive.  No sinus pain/pressure.  Denies SOB.  No ear pain.  No known sick contacts.     Review of Systems For ROS see HPI     Objective:   Physical Exam  Constitutional: He appears well-developed and well-nourished. No distress.  HENT:  Head: Normocephalic and atraumatic.  Right Ear: Tympanic membrane normal.  Left Ear: Tympanic membrane normal.  Nose: No mucosal edema or rhinorrhea. Right sinus exhibits no maxillary sinus tenderness and no frontal sinus tenderness. Left sinus exhibits no maxillary sinus tenderness and no frontal sinus tenderness.  Mouth/Throat: Mucous membranes are normal. No oropharyngeal exudate, posterior oropharyngeal edema or posterior oropharyngeal erythema.  Eyes: Conjunctivae and EOM are normal. Pupils are equal, round, and reactive to light.  Neck: Normal range of motion. Neck supple.  Cardiovascular: Normal rate, regular rhythm and normal heart sounds.   Pulmonary/Chest: Effort normal and breath sounds normal. No respiratory distress. He has no wheezes.  + hacking cough- improved s/p neb tx  Lymphadenopathy:    He has no cervical adenopathy.  Skin: Skin is warm and dry.  Vitals reviewed.         Assessment & Plan:

## 2015-07-22 NOTE — Patient Instructions (Signed)
Follow up as needed This is called a post-infectious cough and will improve w/ time Use the Albuterol as needed for coughing fits Cough syrup for nights/weekends (will cause drowsiness) Ibuprofen (Advil) twice daily to improve cough and airway inflammation Drink plenty of fluids REST! Call with any questions or concerns If you want to join [place] at the new [place], any scheduled appointments will automatically transfer and we will see you at [address], [place], [place] [id number] (OPENING 08/04/15) Happy Holidays!!!

## 2015-07-23 DIAGNOSIS — R05 Cough: Secondary | ICD-10-CM | POA: Insufficient documentation

## 2015-07-23 DIAGNOSIS — R059 Cough, unspecified: Secondary | ICD-10-CM | POA: Insufficient documentation

## 2015-07-23 NOTE — Assessment & Plan Note (Signed)
New.  Suspect pt has a post-infectious cough as he otherwise feels well.  No fevers.  No evidence of bacterial infxn on PE.  Cough and air movement improved s/p neb tx.  Start albuterol prn and cough meds as needed.  Pt expressed understanding and is in agreement w/ plan.

## 2016-02-10 ENCOUNTER — Encounter: Payer: Self-pay | Admitting: Family Medicine

## 2016-02-10 ENCOUNTER — Ambulatory Visit (INDEPENDENT_AMBULATORY_CARE_PROVIDER_SITE_OTHER): Payer: BLUE CROSS/BLUE SHIELD | Admitting: Family Medicine

## 2016-02-10 VITALS — BP 114/76 | HR 78 | Temp 97.8°F | Ht 70.5 in | Wt 287.6 lb

## 2016-02-10 DIAGNOSIS — R110 Nausea: Secondary | ICD-10-CM | POA: Diagnosis not present

## 2016-02-10 DIAGNOSIS — R42 Dizziness and giddiness: Secondary | ICD-10-CM

## 2016-02-10 LAB — CBC
HCT: 43 % (ref 39.0–52.0)
Hemoglobin: 14.8 g/dL (ref 13.0–17.0)
MCHC: 34.3 g/dL (ref 30.0–36.0)
MCV: 86.7 fl (ref 78.0–100.0)
Platelets: 208 10*3/uL (ref 150.0–400.0)
RBC: 4.96 Mil/uL (ref 4.22–5.81)
RDW: 12.9 % (ref 11.5–15.5)
WBC: 6.2 10*3/uL (ref 4.0–10.5)

## 2016-02-10 LAB — COMPREHENSIVE METABOLIC PANEL
ALT: 25 U/L (ref 0–53)
AST: 21 U/L (ref 0–37)
Albumin: 4.5 g/dL (ref 3.5–5.2)
Alkaline Phosphatase: 54 U/L (ref 39–117)
BILIRUBIN TOTAL: 0.8 mg/dL (ref 0.2–1.2)
BUN: 24 mg/dL — ABNORMAL HIGH (ref 6–23)
CHLORIDE: 96 meq/L (ref 96–112)
CO2: 32 meq/L (ref 19–32)
CREATININE: 0.79 mg/dL (ref 0.40–1.50)
Calcium: 9.6 mg/dL (ref 8.4–10.5)
GFR: 117.48 mL/min (ref >60.00)
GLUCOSE: 77 mg/dL (ref 70–99)
Potassium: 4.1 mEq/L (ref 3.5–5.1)
SODIUM: 145 meq/L (ref 135–145)
Total Protein: 7 g/dL (ref 6.0–8.3)

## 2016-02-10 MED ORDER — ONDANSETRON HCL 4 MG PO TABS: 4.0000 mg | ORAL_TABLET | Freq: Three times a day (TID) | ORAL | Status: DC | PRN

## 2016-02-10 NOTE — Progress Notes (Signed)
Pre visit review using our clinic review tool, if applicable. No additional management support is needed unless otherwise documented below in the visit note. 

## 2016-02-10 NOTE — Progress Notes (Signed)
[facility] [address], [address] [place], [place] [address] [phone] [phone]  Date:  02/10/2016   Name:  Jack Peterson   DOB:  1981/04/29   MRN:  [id number]  PCP:  Neena RhymesKatherine Tabori, MD    Chief Complaint: Nausea   History of Present Illness:  Jack Peterson is a 35 y.o. very pleasant male patient who presents with the following:  He has "felt bad the last couple of days"- he has noted nausea, dizziness = lightheaded.  He had a couple of soft stools yesterday- not diarrhea however He did not vomit but did feel like he could throw up No cough or ST, no fever, rashes He works in a hot environment and wonders if he just overdid it. He would like to be out of work tonight to rest (he works nights)  No sick contacts.  No travel, camping, or any unusal foods This does not remind him of when he had kidney stones.   He did have lithotripsy last fall  He is not having belly pain.    He has not tried any medications for his sx He has been working a lot lately and has been stressed  Patient Active Problem List   Diagnosis Date Noted  . Cough 07/23/2015  . Acute bacterial bronchitis 06/16/2015  . Otitis media 06/16/2015  . BRBPR (bright red blood per rectum) 09/12/2014  . Routine general medical examination at a health care facility 08/23/2012  . Hematuria 04/10/2012  . Sinusitis 09/09/2011  . URI (upper respiratory infection) 07/28/2011  . GERD 03/26/2010  . BACK PAIN 03/26/2010  . NEPHROLITHIASIS, HX OF 03/26/2010    Past Medical History  Diagnosis Date  . GERD (gastroesophageal reflux disease)   . History of nephrolithiasis     Past Surgical History  Procedure Laterality Date  . Tonsillectomy      Social History  Substance Use Topics  . Smoking status: Former Games developermoker  . Smokeless tobacco: Never Used  . Alcohol Use: 1.2 - 1.8 oz/week    2-3 Standard drinks or equivalent per week    Family History  Problem  Relation Age of Onset  . Hypertension Father   . Hypertension Maternal Grandmother   . Cancer Paternal Grandfather     prostate  . Diabetes Neg Hx   . Stroke Neg Hx     No Known Allergies  Medication list has been reviewed and updated.  No current outpatient prescriptions on file prior to visit.   No current facility-administered medications on file prior to visit.    Review of Systems:  As per HPI- otherwise negative.   Physical Examination: Filed Vitals:   02/10/16 1014  BP: 114/76  Pulse: 78  Temp: 97.8 F (36.6 C)   Filed Vitals:   02/10/16 1014  Height: 5' 10.5" (1.791 m)  Weight: 287 lb 9.6 oz (130.455 kg)   Body mass index is 40.67 kg/(m^2). Ideal Body Weight: Weight in (lb) to have BMI = 25: 176.4  GEN: WDWN, NAD, Non-toxic, A & O x 3, obese, looks well HEENT: Atraumatic, Normocephalic. Neck supple. No masses, No LAD.  Bilateral TM wnl, oropharynx normal.  PEERL,EOMI.   Ears and Nose: No external deformity. CV: RRR, No M/G/R. No JVD. No thrill. No extra heart sounds. PULM: CTA B, no wheezes, crackles, rhonchi. No retractions. No resp. distress. No accessory muscle use. ABD: S, NT, ND, +BS. No rebound. No HSM.  Benign belly  EXTR: No c/c/e NEURO Normal gait.  PSYCH: Normally interactive. Conversant. Not depressed or anxious appearing.  Calm demeanor.   Orthostatic VS for the past 24 hrs:  BP- Lying Pulse- Lying BP- Sitting Pulse- Sitting BP- Standing at 0 minutes Pulse- Standing at 0 minutes  02/10/16 1102 115/64 mmHg 64 120/69 mmHg 71 124/81 mmHg 79    Assessment and Plan: Nausea without vomiting - Plan: ondansetron (ZOFRAN) 4 MG tablet  Dizziness and giddiness - Plan: CBC, Comprehensive metabolic panel  Here today with nausea and mild malaise Check labs and gave him a note for work - suspect he is tired and perhaps getting overheated at work Encouraged hydration and to try and stay cool See patient instructions for more details.      Signed Abbe Amsterdam, MD

## 2016-02-10 NOTE — Patient Instructions (Signed)
You may be a little bit dehydrated- try adding some gatorade to your daily routine.  I will be in touch with your labs asap Try to rest and stay cool!  Use the zofran if needed for nausea Let me know if you are not feeling better soon

## 2016-04-21 DIAGNOSIS — Z87891 Personal history of nicotine dependence: Secondary | ICD-10-CM | POA: Diagnosis not present

## 2016-04-21 DIAGNOSIS — R0789 Other chest pain: Secondary | ICD-10-CM | POA: Diagnosis not present

## 2016-04-21 DIAGNOSIS — R079 Chest pain, unspecified: Secondary | ICD-10-CM | POA: Diagnosis not present

## 2016-04-21 DIAGNOSIS — R0602 Shortness of breath: Secondary | ICD-10-CM | POA: Diagnosis not present

## 2016-04-21 DIAGNOSIS — F419 Anxiety disorder, unspecified: Secondary | ICD-10-CM | POA: Diagnosis not present

## 2016-07-15 ENCOUNTER — Ambulatory Visit: Payer: BLUE CROSS/BLUE SHIELD | Admitting: Family Medicine

## 2016-07-15 DIAGNOSIS — Z0289 Encounter for other administrative examinations: Secondary | ICD-10-CM

## 2016-07-20 ENCOUNTER — Ambulatory Visit: Payer: BLUE CROSS/BLUE SHIELD | Admitting: Family Medicine

## 2016-07-20 ENCOUNTER — Telehealth: Payer: Self-pay | Admitting: Family Medicine

## 2016-07-20 NOTE — Telephone Encounter (Signed)
Jack CablesJessica C Copland, MD  Jack Peterson        No charge- please remind of policy

## 2016-07-20 NOTE — Telephone Encounter (Signed)
Patient lvm 8:29am  cancelling 10:15am appointment for today, charge or no charge

## 2016-08-04 ENCOUNTER — Ambulatory Visit (INDEPENDENT_AMBULATORY_CARE_PROVIDER_SITE_OTHER): Payer: BLUE CROSS/BLUE SHIELD | Admitting: Family Medicine

## 2016-08-04 ENCOUNTER — Encounter: Payer: Self-pay | Admitting: Family Medicine

## 2016-08-04 VITALS — BP 140/82 | HR 92 | Temp 98.1°F | Resp 16 | Ht 71.0 in | Wt 248.4 lb

## 2016-08-04 DIAGNOSIS — N529 Male erectile dysfunction, unspecified: Secondary | ICD-10-CM

## 2016-08-04 MED ORDER — SILDENAFIL CITRATE 100 MG PO TABS: 50.0000 mg | ORAL_TABLET | Freq: Every day | ORAL | 3 refills | Status: DC | PRN

## 2016-08-04 NOTE — Progress Notes (Signed)
   Subjective:    Patient ID: Jack Peterson, male    DOB: March 06, 1981, 36 y.o.   MRN: [id number]  HPI ED- sxs started ~1 month ago.  Having difficulty initiating and maintaining erections.  Able to have spontaneous AM erections.  Recently has had more difficulty than not.  No new or different meds.  No recent stressors or relationship issues.  This is new for pt.   Review of Systems For ROS see HPI     Objective:   Physical Exam  Constitutional: He is oriented to person, place, and time. He appears well-developed and well-nourished. No distress.  obese  Neurological: He is alert and oriented to person, place, and time.  Skin: Skin is warm and dry. No erythema.  Psychiatric: He has a normal mood and affect. His behavior is normal. Thought content normal.  Nervous to talk about this issue  Vitals reviewed.         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  No new meds/supplements.  Encouraged open communication w/ partner, increased foreplay, relaxing and trying not to stress about this.  Will start Viagra 1/2 tab just for pt to use a few times to get over the mental block he currently has.  Pt expressed understanding and is in agreement w/ plan.

## 2016-08-04 NOTE — Patient Instructions (Signed)
Follow up as needed/scheduled Try and relax!  This often becomes a self fulfilling prophecy! Use the Viagra as needed- start 1/2 tab Call with any questions or concerns Hang in there!  This will get better!!

## 2016-08-04 NOTE — Progress Notes (Signed)
Pre visit review using our clinic review tool, if applicable. No additional management support is needed unless otherwise documented below in the visit note. 

## 2017-03-21 ENCOUNTER — Encounter: Payer: Self-pay | Admitting: Physician Assistant

## 2017-03-21 ENCOUNTER — Ambulatory Visit (INDEPENDENT_AMBULATORY_CARE_PROVIDER_SITE_OTHER): Payer: BLUE CROSS/BLUE SHIELD | Admitting: Physician Assistant

## 2017-03-21 VITALS — BP 130/82 | HR 74 | Temp 98.7°F | Resp 14 | Ht 71.0 in | Wt 296.0 lb

## 2017-03-21 DIAGNOSIS — M79672 Pain in left foot: Secondary | ICD-10-CM | POA: Diagnosis not present

## 2017-03-21 MED ORDER — MELOXICAM 15 MG PO TABS: 15.0000 mg | ORAL_TABLET | Freq: Every day | ORAL | 0 refills | Status: DC

## 2017-03-21 NOTE — Progress Notes (Signed)
Pre visit review using our clinic review tool, if applicable. No additional management support is needed unless otherwise documented below in the visit note. 

## 2017-03-21 NOTE — Patient Instructions (Signed)
Please take Meloxicam as directed once daily with food. Elevate foot while resting. Ice for any swelling.  Wear ACE wrap daily as compression can help with pain. Follow-up if symptoms are not resolving.

## 2017-03-21 NOTE — Progress Notes (Signed)
   Patient presents to clinic today c/o pain in the dorsum of L foot first noted on Sunday. Denies known trauma or injury but walks significant distances daily and was at the [employer] this weekend. Endorses mild swelling but denies redness or rash. Does wear steel-toed boots at work. Has not taken anything for symptoms.   Past Medical History:  Diagnosis Date  . GERD (gastroesophageal reflux disease)   . History of nephrolithiasis     No current outpatient prescriptions on file prior to visit.   No current facility-administered medications on file prior to visit.     No Known Allergies  Family History  Problem Relation Age of Onset  . Hypertension Father   . Hypertension Maternal Grandmother   . Cancer Paternal Grandfather        prostate  . Diabetes Neg Hx   . Stroke Neg Hx     Social History   Social History  . Marital status: Married    Spouse name: N/A  . Number of children: 0  . Years of education: N/A   Occupational History  . Chemical Operator [employer]   Social History Main Topics  . Smoking status: Former Games developer  . Smokeless tobacco: Never Used  . Alcohol use 1.2 - 1.8 oz/week    2 - 3 Standard drinks or equivalent per week  . Drug use: No  . Sexual activity: Not Asked   Other Topics Concern  . None   Social History Narrative  . None   Review of Systems - See HPI.  All other ROS are negative.  BP 130/82   Pulse 74   Temp 98.7 F (37.1 C) (Oral)   Resp 14   Ht 5\' 11"  (1.803 m)   Wt 296 lb (134.3 kg)   SpO2 98%   BMI 41.28 kg/m   Physical Exam  Constitutional: He is oriented to person, place, and time and well-developed, well-nourished, and in no distress.  HENT:  Head: Normocephalic and atraumatic.  Eyes: Conjunctivae are normal.  Cardiovascular: Normal rate, regular rhythm, normal heart sounds and intact distal pulses.   Pulmonary/Chest: Effort normal.  Musculoskeletal:       Left ankle: He exhibits normal range of  motion and no swelling. Tenderness. AITFL tenderness found. No lateral malleolus and no medial malleolus tenderness found. Achilles tendon normal.  Neurological: He is alert and oriented to person, place, and time.  Skin: Skin is warm and dry. No rash noted.  Psychiatric: Affect normal.  Vitals reviewed.  Assessment/Plan: 1. Left foot pain Tenderness to palpation of ATFL. Mild swelling at that area. ROM within normal limits. No bony tenderness. Start RICE. Meloxicam once daily for 4-5 days. Follow-up if not improving.    Piedad Climes, PA-C

## 2017-09-14 ENCOUNTER — Ambulatory Visit: Payer: BLUE CROSS/BLUE SHIELD | Admitting: Family Medicine

## 2017-09-14 ENCOUNTER — Other Ambulatory Visit: Payer: Self-pay

## 2017-09-14 ENCOUNTER — Encounter: Payer: Self-pay | Admitting: Family Medicine

## 2017-09-14 VITALS — BP 128/90 | HR 79 | Temp 99.0°F | Ht 71.0 in | Wt 312.0 lb

## 2017-09-14 DIAGNOSIS — J208 Acute bronchitis due to other specified organisms: Secondary | ICD-10-CM | POA: Diagnosis not present

## 2017-09-14 DIAGNOSIS — L508 Other urticaria: Secondary | ICD-10-CM

## 2017-09-14 NOTE — Progress Notes (Signed)
Subjective  CC:  Chief Complaint  Patient presents with  . Cough  . Rash    On Back and Legs  . Wheezing    HPI: SUBJECTIVE:  Jack Peterson is a 37 y.o. male who complains of congestion, nasal blockage, post nasal drip, cough described as dry and denies sinus, high fevers, SOB, chest pain or significant GI symptoms. Symptoms have been present for 2-3 days. For the most part he feels fine. He denies a history of anorexia, dizziness, vomiting and wheezing. He denies a history of asthma or COPD. Patient does not smoke cigarettes. Also broke out in hives responsive to benadryl - small and scattered on scalp, torso and legs. No h/o urticaria.   I reviewed the patients updated PMH, FH, and SocHx.  Social History: Patient  reports that he has quit smoking. he has never used smokeless tobacco. He reports that he drinks about 1.2 - 1.8 oz of alcohol per week. He reports that he does not use drugs.  Patient Active Problem List   Diagnosis Date Noted  . Cough 07/23/2015  . Acute bacterial bronchitis 06/16/2015  . Otitis media 06/16/2015  . BRBPR (bright red blood per rectum) 09/12/2014  . Routine general medical examination at a health care facility 08/23/2012  . Hematuria 04/10/2012  . Sinusitis 09/09/2011  . URI (upper respiratory infection) 07/28/2011  . GERD 03/26/2010  . BACK PAIN 03/26/2010  . NEPHROLITHIASIS, HX OF 03/26/2010    Review of Systems: Cardiovascular: negative for chest pain Respiratory: negative for SOB or hemoptysis Gastrointestinal: negative for abdominal pain Genitourinary: negative for dysuria or gross hematuria No outpatient medications have been marked as taking for the 09/14/17 encounter (Office Visit) with Willow Ora, MD.    Objective  Vitals: BP 128/90   Pulse 79   Temp 99 F (37.2 C)   Ht 5\' 11"  (1.803 m)   Wt (!) 312 lb (141.5 kg)   SpO2 98%   BMI 43.52 kg/m  General: no acute distress  Psych:  Alert and oriented, normal mood and  affect HEENT:  Normocephalic, atraumatic, supple neck, moist mucous membranes, mildly erythematous pharynx without exudate, mild lymphadenopathy, supple neck Cardiovascular:  RRR without murmur. no edema Respiratory:  Good breath sounds bilaterally, CTAB with normal respiratory effort with occasional rhonchi, no wheezing but can forcefully expire and cause upper exp wheeze Skin:  Warm, few scattered urticarial lesions on torso and legs, no target lesions, ulcerations or vesicles. Neurologic:   Mental status is normal. normal gait  Assessment  1. Viral bronchitis   2. Viral urticaria      Plan  Discussion:  Treat supportively - most c/w viral etiology. otc meds discussed. Start zyrtec for hives and prn benadryl.  Education regarding differences between viral and bacterial infections and treatment options are discussed.  Supportive care measures are recommended.  We discussed the use of mucolytic's, decongestants, antihistamines and antitussives as needed.  Tylenol or Advil are recommended if needed.  Follow up: if sxs persist or worsen.   Commons side effects, risks, benefits, and alternatives for medications and treatment plan prescribed today were discussed, and the patient expressed understanding of the given instructions. Patient is instructed to call or message via MyChart if he/she has any questions or concerns regarding our treatment plan. No barriers to understanding were identified. We discussed Red Flag symptoms and signs in detail. Patient expressed understanding regarding what to do in case of urgent or emergency type symptoms.  Medication list was reconciled,  printed and provided to the patient in AVS. Patient instructions and summary information was reviewed with the patient as documented in the AVS. This note was prepared with assistance of Dragon voice recognition software. Occasional wrong-word or sound-a-like substitutions may have occurred due to the inherent limitations of  voice recognition software  No orders of the defined types were placed in this encounter.  No orders of the defined types were placed in this encounter.

## 2017-09-14 NOTE — Patient Instructions (Addendum)
Please follow up if symptoms do not improve or as needed.    Acute Bronchitis, Adult Acute bronchitis is sudden (acute) swelling of the air tubes (bronchi) in the lungs. Acute bronchitis causes these tubes to fill with mucus, which can make it hard to breathe. It can also cause coughing or wheezing. In adults, acute bronchitis usually goes away within 2 weeks. A cough caused by bronchitis may last up to 3 weeks. Smoking, allergies, and asthma can make the condition worse. Repeated episodes of bronchitis may cause further lung problems, such as chronic obstructive pulmonary disease (COPD). What are the causes? This condition can be caused by germs and by substances that irritate the lungs, including:  Cold and flu viruses. This condition is most often caused by the same virus that causes a cold.  Bacteria.  Exposure to tobacco smoke, dust, fumes, and air pollution.  What increases the risk? This condition is more likely to develop in people who:  Have close contact with someone with acute bronchitis.  Are exposed to lung irritants, such as tobacco smoke, dust, fumes, and vapors.  Have a weak immune system.  Have a respiratory condition such as asthma.  What are the signs or symptoms? Symptoms of this condition include:  A cough.  Coughing up clear, yellow, or green mucus.  Wheezing.  Chest congestion.  Shortness of breath.  A fever.  Body aches.  Chills.  A sore throat.  How is this diagnosed? This condition is usually diagnosed with a physical exam. During the exam, your health care provider may order tests, such as chest X-rays, to rule out other conditions. He or she may also:  Test a sample of your mucus for bacterial infection.  Check the level of oxygen in your blood. This is done to check for pneumonia.  Do a chest X-ray or lung function testing to rule out pneumonia and other conditions.  Perform blood tests.  Your health care provider will also ask  about your symptoms and medical history. How is this treated? Most cases of acute bronchitis clear up over time without treatment. Your health care provider may recommend:  Drinking more fluids. Drinking more makes your mucus thinner, which may make it easier to breathe.  Taking a medicine for a fever or cough.  Taking an antibiotic medicine.  Using an inhaler to help improve shortness of breath and to control a cough.  Using a cool mist vaporizer or humidifier to make it easier to breathe.  Follow these instructions at home: Medicines  Take over-the-counter and prescription medicines only as told by your health care provider.  If you were prescribed an antibiotic, take it as told by your health care provider. Do not stop taking the antibiotic even if you start to feel better. General instructions  Get plenty of rest.  Drink enough fluids to keep your urine clear or pale yellow.  Avoid smoking and secondhand smoke. Exposure to cigarette smoke or irritating chemicals will make bronchitis worse. If you smoke and you need help quitting, ask your health care provider. Quitting smoking will help your lungs heal faster.  Use an inhaler, cool mist vaporizer, or humidifier as told by your health care provider.  Keep all follow-up visits as told by your health care provider. This is important. How is this prevented? To lower your risk of getting this condition again:  Wash your hands often with soap and water. If soap and water are not available, use hand sanitizer.  Avoid contact with   who have cold symptoms.  Try not to touch your hands to your mouth, nose, or eyes.  Make sure to get the flu shot every year.  Contact a health care provider if:  Your symptoms do not improve in 2 weeks of treatment. Get help right away if:  You cough up blood.  You have chest pain.  You have severe shortness of breath.  You become dehydrated.  You faint or keep feeling like you are  going to faint.  You keep vomiting.  You have a severe headache.  Your fever or chills gets worse. This information is not intended to replace advice given to you by your health care provider. Make sure you discuss any questions you have with your health care provider. Document Released: 08/25/2004 Document Revised: 02/10/2016 Document Reviewed: 01/06/2016 Elsevier Interactive Patient Education  2018 [employer]   Hives Hives (urticaria) are itchy, red, swollen areas on your skin. Hives can appear on any part of your body and can vary in size. They can be as small as the tip of a pen or much larger. Hives often fade within 24 hours (acute hives). In other cases, new hives appear after old ones fade. This cycle can continue for several days or weeks (chronic hives). Hives result from your body's reaction to an irritant or to something that you are allergic to (trigger). When you are exposed to a trigger, your body releases a chemical (histamine) that causes redness, itching, and swelling. You can get hives immediately after being exposed to a trigger or hours later. Hives do not spread from person to person (are not contagious). Your hives may get worse with scratching, exercise, and emotional stress. What are the causes? Causes of this condition include:  Allergies to certain foods or ingredients.  Insect bites or stings.  Exposure to pollen or pet dander.  Contact with latex or chemicals.  Spending time in sunlight, heat, or cold (exposure).  Exercise.  Stress.  You can also get hives from some medical conditions and treatments. These include:  Viruses, including the common cold.  Bacterial infections, such as urinary tract infections and strep throat.  Disorders such as vasculitis, lupus, or thyroid disease.  Certain medications.  Allergy shots.  Blood transfusions.  Sometimes, the cause of hives is not known (idiopathic hives). What increases the risk? This  condition is more likely to develop in:  Women.  People who have food allergies, especially to citrus fruits, milk, eggs, peanuts, tree nuts, or shellfish.  People who are allergic to: ? Medicines. ? Latex. ? Insects. ? Animals. ? Pollen.  People who have certain medical conditions, includinglupus or thyroid disease.  What are the signs or symptoms? The main symptom of this condition is raised, itchyred or white bumps or patches on your skin. These areas may:  Become large and swollen (welts).  Change in shape and location, quickly and repeatedly.  Be separate hives or connect over a large area of skin.  Sting or become painful.  Turn white when pressed in the center (blanch).  In severe cases, yourhands, feet, and face may also become swollen. This may occur if hives develop deeper in your skin. How is this diagnosed? This condition is diagnosed based on your symptoms, medical history, and physical exam. Your skin, urine, or blood may be tested to find out what is causing your hives and to rule out other health issues. Your health care provider may also remove a small sample of skin from the  affected area and examine it under a microscope (biopsy). How is this treated? Treatment depends on the severity of your condition. Your health care provider may recommend using cool, wet cloths (cool compresses) or taking cool showers to relieve itching. Hives are sometimes treated with medicines, including:  Antihistamines.  Corticosteroids.  Antibiotics.  An injectable medicine (omalizumab). Your health care provider may prescribe this if you have chronic idiopathic hives and you continue to have symptoms even after treatment with antihistamines.  Severe cases may require an emergency injection of adrenaline (epinephrine) to prevent a life-threatening allergic reaction (anaphylaxis). Follow these instructions at home: Medicines  Take or apply over-the-counter and prescription  medicines only as told by your health care provider.  If you were prescribed an antibiotic medicine, use it as told by your health care provider. Do not stop taking the antibiotic even if you start to feel better. Skin Care  Apply cool compresses to the affected areas.  Do not scratch or rub your skin. General instructions  Do not take hot showers or baths. This can make itching worse.  Do not wear tight-fitting clothing.  Use sunscreen and wear protective clothing when you are outside.  Avoid any substances that cause your hives. Keep a journal to help you track what causes your hives. Write down: ? What medicines you take. ? What you eat and drink. ? What products you use on your skin.  Keep all follow-up visits as told by your health care provider. This is important. Contact a health care provider if:  Your symptoms are not controlled with medicine.  Your joints are painful or swollen. Get help right away if:  You have a fever.  You have pain in your abdomen.  Your tongue or lips are swollen.  Your eyelids are swollen.  Your chest or throat feels tight.  You have trouble breathing or swallowing. These symptoms may represent a serious problem that is an emergency. Do not wait to see if the symptoms will go away. Get medical help right away. Call your local emergency services (911 in the [place] Do not drive yourself to the hospital. This information is not intended to replace advice given to you by your health care provider. Make sure you discuss any questions you have with your health care provider. Document Released: 07/18/2005 Document Revised: 12/16/2015 Document Reviewed: 05/06/2015 Elsevier Interactive Patient Education  2018 [employer]

## 2018-03-24 ENCOUNTER — Encounter (HOSPITAL_BASED_OUTPATIENT_CLINIC_OR_DEPARTMENT_OTHER): Payer: Self-pay | Admitting: *Deleted

## 2018-03-24 ENCOUNTER — Other Ambulatory Visit: Payer: Self-pay

## 2018-03-24 ENCOUNTER — Emergency Department (HOSPITAL_BASED_OUTPATIENT_CLINIC_OR_DEPARTMENT_OTHER)
Admission: EM | Admit: 2018-03-24 | Discharge: 2018-03-24 | Disposition: A | Discharge status: Home / Self Care | Payer: BLUE CROSS/BLUE SHIELD | Attending: Emergency Medicine | Admitting: Emergency Medicine

## 2018-03-24 DIAGNOSIS — Y9389 Activity, other specified: Secondary | ICD-10-CM | POA: Insufficient documentation

## 2018-03-24 DIAGNOSIS — Z87891 Personal history of nicotine dependence: Secondary | ICD-10-CM | POA: Insufficient documentation

## 2018-03-24 DIAGNOSIS — T18128A Food in esophagus causing other injury, initial encounter: Secondary | ICD-10-CM | POA: Diagnosis not present

## 2018-03-24 DIAGNOSIS — T189XXA Foreign body of alimentary tract, part unspecified, initial encounter: Secondary | ICD-10-CM | POA: Diagnosis present

## 2018-03-24 DIAGNOSIS — T18120A Food in esophagus causing compression of trachea, initial encounter: Secondary | ICD-10-CM | POA: Insufficient documentation

## 2018-03-24 DIAGNOSIS — Y92018 Other place in single-family (private) house as the place of occurrence of the external cause: Secondary | ICD-10-CM | POA: Diagnosis not present

## 2018-03-24 DIAGNOSIS — X58XXXA Exposure to other specified factors, initial encounter: Secondary | ICD-10-CM | POA: Diagnosis not present

## 2018-03-24 DIAGNOSIS — Y999 Unspecified external cause status: Secondary | ICD-10-CM | POA: Insufficient documentation

## 2018-03-24 DIAGNOSIS — K222 Esophageal obstruction: Secondary | ICD-10-CM

## 2018-03-24 MED ORDER — GLUCAGON HCL RDNA (DIAGNOSTIC) 1 MG IJ SOLR
1.0000 mg | Freq: Once | INTRAMUSCULAR | Status: AC
  Administered 2018-03-24: 1 mg via INTRAVENOUS
  Filled 2018-03-24: qty 1

## 2018-03-24 MED ORDER — LORAZEPAM 2 MG/ML IJ SOLN
1.0000 mg | Freq: Once | INTRAMUSCULAR | Status: AC
  Administered 2018-03-24: 1 mg via INTRAVENOUS
  Filled 2018-03-24: qty 1

## 2018-03-24 NOTE — ED Notes (Signed)
Alert, NAD, calm, interactive, resps e/u, speaking in clear complete sentences, no dyspnea noted, skin W&D, continual regurgitation and spitting, distant h/o same with GI, endo, retrieval, and esophageal stretching, mentions some epigastric discomfort,  (denies: other pain, sob, nausea, dizziness or visual changes). [facility]

## 2018-03-24 NOTE — ED Notes (Signed)
Gagged and vomited, reports steak came back up, now tolerating PO fluids, "feel better". Remains alert, NAD, calm, interactive, resps e/u, speaking in clear complete sentences, no dyspnea noted, skin W&D, (denies: pain, sob, nausea, dizziness or visual changes). EDPA into check on pt. [facility]

## 2018-03-24 NOTE — Discharge Instructions (Addendum)
I have listed the information below to [facility]'s leak and establish care.  You likely will need another esophageal dilation in the future to prevent this from happening again.  As you are well aware, make sure you chew food thoroughly before swallowing.  Return to the ER if you have any new or concerning symptoms like fever, trouble breathing or swallowing.

## 2018-03-24 NOTE — ED Triage Notes (Signed)
Pt reports he was eating steak pta and feels like a piece got stuck. States he drank water and immediately vomited. He reports a history of having his esophagus stretched. Pt speaking full sentences with no acute distress. He is spitting into a bucket during triage.

## 2018-03-24 NOTE — ED Provider Notes (Signed)
[facility] EMERGENCY DEPARTMENT Provider Note   CSN: [id number] Arrival date & time: 03/24/18  2020     History   Chief Complaint Chief Complaint  Patient presents with  . Swallowed Foreign Body    HPI Jack Peterson is a 37 y.o. male.  HPI  Jack Peterson is a 37 year old male with a history of esophageal constriction requiring dilation who presents to the emergency department for evaluation of esophageal obstruction from food bolus.  Patient reports that a piece of steak got stuck in his esophagus approximately 15 minutes prior to arrival.  He immediately ran to the bathroom and tried to vomit, but was unsuccessful.  He tried drinking water, but immediately vomited it.  He is unable to swallow at all and must spit up his saliva.  Reports that this happened to him about 13 years ago and he had to have the food bolus removed via endoscopy.  He does not have a regular GI doctor.  He denies trouble breathing, voice change, fever.  Past Medical History:  Diagnosis Date  . GERD (gastroesophageal reflux disease)   . History of nephrolithiasis     Patient Active Problem List   Diagnosis Date Noted  . Cough 07/23/2015  . Acute bacterial bronchitis 06/16/2015  . Otitis media 06/16/2015  . BRBPR (bright red blood per rectum) 09/12/2014  . Routine general medical examination at a health care facility 08/23/2012  . Hematuria 04/10/2012  . Sinusitis 09/09/2011  . URI (upper respiratory infection) 07/28/2011  . GERD 03/26/2010  . BACK PAIN 03/26/2010  . NEPHROLITHIASIS, HX OF 03/26/2010    Past Surgical History:  Procedure Laterality Date  . ESOPHAGEAL DILATION    . TONSILLECTOMY          Home Medications    Prior to Admission medications   Not on File    Family History Family History  Problem Relation Age of Onset  . Hypertension Father   . Hypertension Maternal Grandmother   . Cancer Paternal Grandfather        prostate  . Diabetes Neg Hx   .  Stroke Neg Hx     Social History Social History   Tobacco Use  . Smoking status: Former Games developermoker  . Smokeless tobacco: Never Used  Substance Use Topics  . Alcohol use: Yes    Alcohol/week: 2.0 - 3.0 standard drinks    Types: 2 - 3 Standard drinks or equivalent per week  . Drug use: No     Allergies   Patient has no known allergies.   Review of Systems Review of Systems  Constitutional: Negative for fever.  HENT: Positive for trouble swallowing (cannot swallow without vomiting).   Respiratory: Negative for shortness of breath.   Gastrointestinal: Positive for vomiting.  All other systems reviewed and are negative.    Physical Exam Updated Vital Signs BP (!) 141/97 (BP Location: Right Arm)   Pulse 88   Temp 98.4 F (36.9 C) (Oral)   Resp 20   Ht 5\' 11"  (1.803 m)   Wt 133.8 kg   SpO2 100%   BMI 41.14 kg/m   Physical Exam  Constitutional: He appears well-developed and well-nourished. No distress.  Appears very uncomfortable and anxious, spitting up secretions into a bucket at bedside.   HENT:  Head: Normocephalic and atraumatic.  Mouth/Throat: Oropharynx is clear and moist. No oropharyngeal exudate.  Unable to swallow secretions.   Eyes: Right eye exhibits no discharge. Left eye exhibits no discharge.  Cardiovascular: Normal  rate and regular rhythm.  Pulmonary/Chest: Effort normal and breath sounds normal. No stridor. No respiratory distress. He has no wheezes. He has no rales.  Abdominal: Soft. There is no tenderness.  Musculoskeletal: Normal range of motion.  Neurological: He is alert. Coordination normal.  Skin: Skin is warm and dry. He is not diaphoretic.  Psychiatric: He has a normal mood and affect. His behavior is normal.  Nursing note and vitals reviewed.    ED Treatments / Results  Labs (all labs ordered are listed, but only abnormal results are displayed) Labs Reviewed - No data to display  EKG None  Radiology No results  found.  Procedures Procedures (including critical care time)  Medications Ordered in ED Medications  glucagon (human recombinant) (GLUCAGEN) injection 1 mg (1 mg Intravenous Given 03/24/18 2126)  LORazepam (ATIVAN) injection 1 mg (1 mg Intravenous Given 03/24/18 2125)     Initial Impression / Assessment and Plan / ED Course  I have reviewed the triage vital signs and the nursing notes.  Pertinent labs & imaging results that were available during my care of the patient were reviewed by me and considered in my medical decision making (see chart for details).     Patient vomited the food bolus after receiving glucagon.  Reports that he peed feels completely improved.  I have given him information to follow-up with gastroenterology to establish care as he may need esophageal dilation in the future.  Discussed return precautions and he agrees and appears reliable.  Final Clinical Impressions(s) / ED Diagnoses   Final diagnoses:  Esophageal obstruction due to food impaction    ED Discharge Orders    None       Kellie Shropshire, PA-C 03/25/18 [id number]    Gwyneth Sprout, MD 03/26/18 [phone]

## 2018-06-21 ENCOUNTER — Telehealth: Payer: Self-pay | Admitting: Family Medicine

## 2018-06-21 NOTE — Telephone Encounter (Signed)
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   Copied from CRM [id number]. Topic: Appointment Scheduling - Scheduling Inquiry for Clinic >> Jun 20, 2018  3:22 PM Jack Peterson, Jack Peterson wrote: Reason for CRM: pt called in to schedule his CPE. Pt says that he is trying to have CPE before the end of the year if possible because he will receive a discount at work if so. Pt would like to know if PCP could complete CPE before the year is out?   CB: [phone]

## 2018-06-21 NOTE — Telephone Encounter (Signed)
LM making pt aware that KT is scheduled out until April but he could see Selena BattenCody if he wanted to get the cpe in before the end of the yr.

## 2018-06-21 NOTE — Telephone Encounter (Signed)
That is fine with me.

## 2018-06-22 DIAGNOSIS — Z0000 Encounter for general adult medical examination without abnormal findings: Secondary | ICD-10-CM | POA: Diagnosis not present

## 2018-09-25 DIAGNOSIS — M545 Low back pain: Secondary | ICD-10-CM | POA: Diagnosis not present

## 2018-09-25 DIAGNOSIS — R200 Anesthesia of skin: Secondary | ICD-10-CM | POA: Diagnosis not present

## 2018-09-27 ENCOUNTER — Other Ambulatory Visit: Payer: Self-pay | Admitting: Neurological Surgery

## 2018-09-27 DIAGNOSIS — M545 Low back pain, unspecified: Secondary | ICD-10-CM

## 2018-10-06 ENCOUNTER — Ambulatory Visit
Admission: RE | Admit: 2018-10-06 | Discharge: 2018-10-06 | Disposition: A | Discharge status: Home / Self Care | Payer: BLUE CROSS/BLUE SHIELD | Source: Ambulatory Visit | Attending: Neurological Surgery | Admitting: Neurological Surgery

## 2018-10-06 DIAGNOSIS — M5127 Other intervertebral disc displacement, lumbosacral region: Secondary | ICD-10-CM | POA: Diagnosis not present

## 2018-10-06 DIAGNOSIS — M545 Low back pain, unspecified: Secondary | ICD-10-CM

## 2018-10-06 DIAGNOSIS — M4807 Spinal stenosis, lumbosacral region: Secondary | ICD-10-CM | POA: Diagnosis not present

## 2018-10-08 DIAGNOSIS — M542 Cervicalgia: Secondary | ICD-10-CM | POA: Insufficient documentation

## 2018-10-08 DIAGNOSIS — G5600 Carpal tunnel syndrome, unspecified upper limb: Secondary | ICD-10-CM | POA: Insufficient documentation

## 2018-10-08 DIAGNOSIS — G5603 Carpal tunnel syndrome, bilateral upper limbs: Secondary | ICD-10-CM | POA: Diagnosis not present

## 2018-11-08 DIAGNOSIS — G5603 Carpal tunnel syndrome, bilateral upper limbs: Secondary | ICD-10-CM | POA: Diagnosis not present

## 2018-12-10 ENCOUNTER — Encounter: Payer: Self-pay | Admitting: Family Medicine

## 2018-12-10 ENCOUNTER — Other Ambulatory Visit: Payer: Self-pay

## 2018-12-10 ENCOUNTER — Ambulatory Visit (INDEPENDENT_AMBULATORY_CARE_PROVIDER_SITE_OTHER): Payer: BLUE CROSS/BLUE SHIELD | Admitting: Family Medicine

## 2018-12-10 VITALS — Temp 99.3°F | Ht 71.0 in | Wt 310.0 lb

## 2018-12-10 DIAGNOSIS — Z20822 Contact with and (suspected) exposure to covid-19: Secondary | ICD-10-CM

## 2018-12-10 DIAGNOSIS — Z20828 Contact with and (suspected) exposure to other viral communicable diseases: Secondary | ICD-10-CM | POA: Diagnosis not present

## 2018-12-10 DIAGNOSIS — R6889 Other general symptoms and signs: Secondary | ICD-10-CM

## 2018-12-10 NOTE — Progress Notes (Signed)
   Virtual Visit via Video   I connected with patient on 12/10/18 at 10:40 AM EDT by a video enabled telemedicine application and verified that I am speaking with the correct person using two identifiers.  Location patient: Home Location provider: [place], Office Persons participating in the virtual visit: Patient, Provider, CMA (Jess B)  I discussed the limitations of evaluation and management by telemedicine and the availability of in person appointments. The patient expressed understanding and agreed to proceed.  Subjective:   HPI:   Cough/Chills- pt reports cough started Friday.  This AM cough was more frequent.  Went to work.  Developed chills despite wearing a coat.  + fatigue, nausea.  Denies body aches, HA.  Had some difficulty breathing in respirator at work this AM.  + sick contact at work who is 'out for 3 days and we don't know what that's about'.  ROS:   See pertinent positives and negatives per HPI.  Patient Active Problem List   Diagnosis Date Noted  . Cough 07/23/2015  . Acute bacterial bronchitis 06/16/2015  . Otitis media 06/16/2015  . BRBPR (bright red blood per rectum) 09/12/2014  . Routine general medical examination at a health care facility 08/23/2012  . Hematuria 04/10/2012  . Sinusitis 09/09/2011  . URI (upper respiratory infection) 07/28/2011  . GERD 03/26/2010  . BACK PAIN 03/26/2010  . NEPHROLITHIASIS, HX OF 03/26/2010    Social History   Tobacco Use  . Smoking status: Former Games developer  . Smokeless tobacco: Never Used  Substance Use Topics  . Alcohol use: Yes    Alcohol/week: 2.0 - 3.0 standard drinks    Types: 2 - 3 Standard drinks or equivalent per week   No current outpatient medications on file.  No Known Allergies  Objective:   Temp 99.3 F (37.4 C) (Oral)   Ht 5\' 11"  (1.803 m)   Wt (!) 310 lb (140.6 kg)   BMI 43.24 kg/m   AAOx3, NAD NCAT, EOMI obese No obvious CN deficits Coloring WNL Pt is able to speak  clearly, coherently without shortness of breath or increased work of breathing.  Thought process is linear.  Mood is appropriate.   Assessment and Plan:   Suspected COVID- new.  Pt has low grade fever, chills, cough, fatigue, nausea- all consistent w/ possible COVID.  + sick contact at work.  Discussed public testing at [facility]- # provided.  Encouraged him to discuss this w/ work.  He is aware that a + test result won't change our management and that right now it's supportive care and monitoring for worsening of sxs.  Reviewed supportive care and red flags that should prompt return.  Pt expressed understanding and is in agreement w/ plan.    Neena Rhymes, MD 12/10/2018

## 2018-12-10 NOTE — Progress Notes (Signed)
I have discussed the procedure for the virtual visit with the patient who has given consent to proceed with assessment and treatment.   Dulcie Gammon L Oliverio Cho, CMA     

## 2019-02-07 ENCOUNTER — Telehealth: Payer: Self-pay | Admitting: Family Medicine

## 2019-02-07 NOTE — Telephone Encounter (Signed)
Pt will need virtual visit tomorrow if possible but if not, will need in-office visit on Monday.  If sxs worsen over the weekend will need to go to [facility] or ER

## 2019-02-07 NOTE — Telephone Encounter (Signed)
Pt called in stating that he is having some flank pain, dark urine with odor. He thinks it could be a Kidney stone. He said the pain isn't that bad. He states he can't come in for an appt until Monday or Tuesday next week. Pt can be reached at home #

## 2019-02-07 NOTE — Telephone Encounter (Signed)
Pt was advised of PCP recommendation pt will go to be seen if symptoms worsen or any pain begins. Pt was able to come in on Monday morning. Appt made for 9:30 with Elyn Aquas, PA-C as PCP has no openings.

## 2019-02-07 NOTE — Telephone Encounter (Signed)
Not sure what can be done if pt is unable to come into office until next week?

## 2019-02-11 ENCOUNTER — Other Ambulatory Visit: Payer: Self-pay

## 2019-02-11 ENCOUNTER — Ambulatory Visit: Payer: BC Managed Care – PPO | Admitting: Physician Assistant

## 2019-02-11 ENCOUNTER — Encounter: Payer: Self-pay | Admitting: Physician Assistant

## 2019-02-11 VITALS — BP 132/86 | HR 71 | Temp 98.5°F | Resp 16 | Ht 71.0 in | Wt 316.0 lb

## 2019-02-11 DIAGNOSIS — Z8042 Family history of malignant neoplasm of prostate: Secondary | ICD-10-CM | POA: Diagnosis not present

## 2019-02-11 DIAGNOSIS — R310 Gross hematuria: Secondary | ICD-10-CM

## 2019-02-11 DIAGNOSIS — Z87442 Personal history of urinary calculi: Secondary | ICD-10-CM

## 2019-02-11 DIAGNOSIS — R350 Frequency of micturition: Secondary | ICD-10-CM

## 2019-02-11 LAB — POCT URINALYSIS DIPSTICK
Bilirubin, UA: NEGATIVE
Glucose, UA: NEGATIVE
Ketones, UA: NEGATIVE
Leukocytes, UA: NEGATIVE
Nitrite, UA: NEGATIVE
Protein, UA: NEGATIVE
Spec Grav, UA: 1.02 (ref 1.010–1.025)
Urobilinogen, UA: 0.2 E.U./dL
pH, UA: 6 (ref 5.0–8.0)

## 2019-02-11 MED ORDER — SILDENAFIL CITRATE 20 MG PO TABS: 20.0000 mg | ORAL_TABLET | ORAL | 0 refills | Status: DC | PRN

## 2019-02-11 NOTE — Progress Notes (Signed)
Patient presents to clinic today c/o about 2 months of hematuria, urinary frequency and some occasional,  foul-smelling urine. Occasional left lower back pain+ history of kidney stones. Denies urinary hesitancy, incomplete emptying. Some post-void dribbling. Nocturia x 0-1. Some change in erectile function -- notes some issue maintaining erection. Has been on Sildenafil previously which helped but is not taking currently. Denies abnormal curvature of penis with erection. Denies painful erection or orgasm. Libido is intact.  + hx lumbar ddd, followed by Neurosurgery with recent MRI on 10/06/2018 (Overall mild disc degeneration without impingement.) Denies any lower extremity numbness, saddle paresthesias, etc.  Former smoker with < 5 year pack-year history.  Father, Paternal Grandfather and Gret-grandfather with Prostate CA. With father, diagnosed at 38.   Past Medical History:  Diagnosis Date  . GERD (gastroesophageal reflux disease)   . History of nephrolithiasis     No current outpatient medications on file prior to visit.   No current facility-administered medications on file prior to visit.     No Known Allergies  Family History  Problem Relation Age of Onset  . Hypertension Father   . Hypertension Maternal Grandmother   . Cancer Paternal Grandfather        prostate  . Diabetes Neg Hx   . Stroke Neg Hx     Social History   Socioeconomic History  . Marital status: Married    Spouse name: Not on file  . Number of children: 0  . Years of education: Not on file  . Highest education level: Not on file  Occupational History  . Occupation: Pharmacist, community: [place]  . Financial resource strain: Not on file  . Food insecurity    Worry: Not on file    Inability: Not on file  . Transportation needs    Medical: Not on file    Non-medical: Not on file  Tobacco Use  . Smoking status: Former Research scientist (life sciences)  . Smokeless tobacco: Never Used   Substance and Sexual Activity  . Alcohol use: Yes    Alcohol/week: 2.0 - 3.0 standard drinks    Types: 2 - 3 Standard drinks or equivalent per week  . Drug use: No  . Sexual activity: Not on file  Lifestyle  . Physical activity    Days per week: Not on file    Minutes per session: Not on file  . Stress: Not on file  Relationships  . Social Herbalist on phone: Not on file    Gets together: Not on file    Attends religious service: Not on file    Active member of club or organization: Not on file    Attends meetings of clubs or organizations: Not on file    Relationship status: Not on file  Other Topics Concern  . Not on file  Social History Narrative  . Not on file   Review of Systems - See HPI.  All other ROS are negative.  BP 132/86   Pulse 71   Temp 98.5 F (36.9 C) (Skin)   Resp 16   Ht 5\' 11"  (1.803 m)   Wt (!) 316 lb (143.3 kg)   SpO2 99%   BMI 44.07 kg/m   Physical Exam Vitals signs reviewed.  Constitutional:      Appearance: Normal appearance. He is obese.  HENT:     Head: Normocephalic and atraumatic.  Neck:     Musculoskeletal: Neck supple.  Cardiovascular:  Rate and Rhythm: Normal rate and regular rhythm.     Pulses: Normal pulses.     Heart sounds: Normal heart sounds.  Pulmonary:     Effort: Pulmonary effort is normal.     Breath sounds: Normal breath sounds.  Abdominal:     General: Bowel sounds are normal. There is no distension.     Palpations: Abdomen is soft. There is no mass.     Tenderness: There is no abdominal tenderness. There is no right CVA tenderness or left CVA tenderness.  Genitourinary:    Penis: Normal.   Neurological:     General: No focal deficit present.     Mental Status: He is alert and oriented to person, place, and time.  Psychiatric:        Mood and Affect: Mood normal.    Assessment/Plan: 1. Urinary frequency 2. Gross hematuria 3. History of nephrolithiasis 4. Family history of prostate cancer   Painless hematuria, sometimes gross hematuria. UA today with + blood but microscopic. No sign of infection but will send for culture. CT Renal/ABd ordered to further assess. If unremarkable will need Urology assessment.    Piedad ClimesWilliam Cody Claretha Townshend, PA-C

## 2019-02-11 NOTE — Addendum Note (Signed)
Addended by: Katina Dung on: 02/11/2019 03:23 PM   Modules accepted: Orders

## 2019-02-11 NOTE — Patient Instructions (Signed)
Please go to the lab today for blood work.  I will call you with your results. We will alter treatment regimen(s) if indicated by your results.   You will be contacted to schedule you CT scan once we get approved through insurance.  Based on results we will determine next steps whether this is medical treatment or need for assessment with Urology.  For ED, ok to restart the generic Viagra, taking as directed.

## 2019-02-12 LAB — URINE CULTURE
MICRO NUMBER:: 660433
Result:: NO GROWTH
SPECIMEN QUALITY:: ADEQUATE

## 2019-02-12 LAB — BASIC METABOLIC PANEL
BUN: 15 mg/dL (ref 6–23)
CO2: 31 mEq/L (ref 19–32)
Calcium: 9.2 mg/dL (ref 8.4–10.5)
Chloride: 102 mEq/L (ref 96–112)
Creatinine, Ser: 0.8 mg/dL (ref 0.40–1.50)
GFR: 107.21 mL/min (ref >60.00)
Glucose, Bld: 92 mg/dL (ref 70–99)
Potassium: 4.5 mEq/L (ref 3.5–5.1)
Sodium: 138 mEq/L (ref 135–145)

## 2019-03-12 DIAGNOSIS — R310 Gross hematuria: Secondary | ICD-10-CM | POA: Diagnosis not present

## 2019-03-12 DIAGNOSIS — N200 Calculus of kidney: Secondary | ICD-10-CM | POA: Diagnosis not present

## 2019-06-03 DIAGNOSIS — R05 Cough: Secondary | ICD-10-CM | POA: Diagnosis not present

## 2019-06-03 DIAGNOSIS — Z20828 Contact with and (suspected) exposure to other viral communicable diseases: Secondary | ICD-10-CM | POA: Diagnosis not present

## 2019-08-27 DIAGNOSIS — M722 Plantar fascial fibromatosis: Secondary | ICD-10-CM | POA: Diagnosis not present

## 2019-08-27 DIAGNOSIS — M7751 Other enthesopathy of right foot: Secondary | ICD-10-CM | POA: Diagnosis not present

## 2019-08-27 DIAGNOSIS — M7752 Other enthesopathy of left foot: Secondary | ICD-10-CM | POA: Diagnosis not present

## 2020-02-20 ENCOUNTER — Encounter: Payer: Self-pay | Admitting: Family Medicine

## 2020-02-20 ENCOUNTER — Ambulatory Visit (INDEPENDENT_AMBULATORY_CARE_PROVIDER_SITE_OTHER): Payer: BC Managed Care – PPO | Admitting: Family Medicine

## 2020-02-20 ENCOUNTER — Other Ambulatory Visit: Payer: Self-pay

## 2020-02-20 VITALS — BP 123/80 | HR 86 | Temp 98.2°F | Resp 16 | Ht 71.0 in | Wt 323.0 lb

## 2020-02-20 DIAGNOSIS — Z8042 Family history of malignant neoplasm of prostate: Secondary | ICD-10-CM

## 2020-02-20 DIAGNOSIS — R300 Dysuria: Secondary | ICD-10-CM | POA: Diagnosis not present

## 2020-02-20 DIAGNOSIS — Z0000 Encounter for general adult medical examination without abnormal findings: Secondary | ICD-10-CM

## 2020-02-20 LAB — BASIC METABOLIC PANEL
BUN: 17 mg/dL (ref 6–23)
CO2: 28 mEq/L (ref 19–32)
Calcium: 9.6 mg/dL (ref 8.4–10.5)
Chloride: 104 mEq/L (ref 96–112)
Creatinine, Ser: 0.75 mg/dL (ref 0.40–1.50)
GFR: 114.91 mL/min (ref >60.00)
Glucose, Bld: 90 mg/dL (ref 70–99)
Potassium: 4.4 mEq/L (ref 3.5–5.1)
Sodium: 138 mEq/L (ref 135–145)

## 2020-02-20 LAB — POCT URINALYSIS DIPSTICK
Bilirubin, UA: NEGATIVE
Glucose, UA: NEGATIVE
Ketones, UA: NEGATIVE
Leukocytes, UA: NEGATIVE
Nitrite, UA: NEGATIVE
Protein, UA: NEGATIVE
Spec Grav, UA: 1.025 (ref 1.010–1.025)
Urobilinogen, UA: 0.2 E.U./dL
pH, UA: 6 (ref 5.0–8.0)

## 2020-02-20 LAB — HEPATIC FUNCTION PANEL
ALT: 23 U/L (ref 0–53)
AST: 18 U/L (ref 0–37)
Albumin: 4.5 g/dL (ref 3.5–5.2)
Alkaline Phosphatase: 57 U/L (ref 39–117)
Bilirubin, Direct: 0.2 mg/dL (ref 0.0–0.3)
Total Bilirubin: 0.7 mg/dL (ref 0.2–1.2)
Total Protein: 6.9 g/dL (ref 6.0–8.3)

## 2020-02-20 LAB — CBC WITH DIFFERENTIAL/PLATELET
Basophils Absolute: 0.1 10*3/uL (ref 0.0–0.1)
Basophils Relative: 0.8 % (ref 0.0–3.0)
Eosinophils Absolute: 0.2 10*3/uL (ref 0.0–0.7)
Eosinophils Relative: 3 % (ref 0.0–5.0)
HCT: 46.4 % (ref 39.0–52.0)
Hemoglobin: 15.9 g/dL (ref 13.0–17.0)
Lymphocytes Relative: 21.2 % (ref 12.0–46.0)
Lymphs Abs: 1.3 10*3/uL (ref 0.7–4.0)
MCHC: 34.2 g/dL (ref 30.0–36.0)
MCV: 87.7 fl (ref 78.0–100.0)
Monocytes Absolute: 0.6 10*3/uL (ref 0.1–1.0)
Monocytes Relative: 9.4 % (ref 3.0–12.0)
Neutro Abs: 4.1 10*3/uL (ref 1.4–7.7)
Neutrophils Relative %: 65.6 % (ref 43.0–77.0)
Platelets: 219 10*3/uL (ref 150.0–400.0)
RBC: 5.29 Mil/uL (ref 4.22–5.81)
RDW: 12.6 % (ref 11.5–15.5)
WBC: 6.3 10*3/uL (ref 4.0–10.5)

## 2020-02-20 LAB — PSA: PSA: 0.27 ng/mL (ref 0.10–4.00)

## 2020-02-20 LAB — LIPID PANEL
Cholesterol: 132 mg/dL (ref 0–200)
HDL: 47.9 mg/dL (ref >39.00)
LDL Cholesterol: 63 mg/dL (ref 0–99)
NonHDL: 83.78
Total CHOL/HDL Ratio: 3
Triglycerides: 106 mg/dL (ref 0.0–149.0)
VLDL: 21.2 mg/dL (ref 0.0–40.0)

## 2020-02-20 LAB — TSH: TSH: 1.1 u[IU]/mL (ref 0.35–4.50)

## 2020-02-20 NOTE — Assessment & Plan Note (Signed)
Deteriorated.  Pt had lost weight but has since gained it back and then some.  He has recently started exercising and trying to eat better.  Applauded his efforts.  Will continue to follow.  Check labs to risk stratify.

## 2020-02-20 NOTE — Progress Notes (Signed)
   Subjective:    Patient ID: Jack Peterson, male    DOB: Jan 16, 1981, 39 y.o.   MRN: [id number]  HPI CPE- UTD on Tdap, COVID vaccine, urology.  Reviewed past medical, surgical, family and social histories.    Review of Systems Patient reports no vision/hearing changes, anorexia, fever ,adenopathy, persistant/recurrent hoarseness, swallowing issues, chest pain, palpitations, edema, persistant/recurrent cough, hemoptysis, dyspnea (rest,exertional, paroxysmal nocturnal), gastrointestinal  bleeding (melena, rectal bleeding), abdominal pain, excessive heart burn, syncope, focal weakness, memory loss, numbness & tingling, skin/hair/nail changes, depression, anxiety, abnormal bruising/bleeding, musculoskeletal symptoms/signs.   + dysuria, darker urine, abnormal odor- sxs started 'a couple of months ago'.  Saw Urology- no obvious cause.  sxs are intermittent  This visit occurred during the SARS-CoV-2 public health emergency.  Safety protocols were in place, including screening questions prior to the visit, additional usage of staff PPE, and extensive cleaning of exam room while observing appropriate contact time as indicated for disinfecting solutions.       Objective:   Physical Exam General Appearance:    Alert, cooperative, no distress, appears stated age, obese  Head:    Normocephalic, without obvious abnormality, atraumatic  Eyes:    PERRL, conjunctiva/corneas clear, EOM's intact, fundi    benign, both eyes       Ears:    Normal TM's and external ear canals, both ears  Nose:   Deferred due to COVID  Throat:   Neck:   Supple, symmetrical, trachea midline, no adenopathy;       thyroid:  No enlargement/tenderness/nodules  Back:     Symmetric, no curvature, ROM normal, no CVA tenderness  Lungs:     Clear to auscultation bilaterally, respirations unlabored  Chest wall:    No tenderness or deformity  Heart:    Regular rate and rhythm, S1 and S2 normal, no murmur, rub   or gallop    Abdomen:     Soft, non-tender, bowel sounds active all four quadrants,    no masses, no organomegaly  Genitalia:    deferred  Rectal:    Extremities:   Extremities normal, atraumatic, no cyanosis or edema  Pulses:   2+ and symmetric all extremities  Skin:   Skin color, texture, turgor normal, no rashes or lesions  Lymph nodes:   Cervical, supraclavicular, and axillary nodes normal  Neurologic:   CNII-XII intact. Normal strength, sensation and reflexes      throughout          Assessment & Plan:

## 2020-02-20 NOTE — Patient Instructions (Signed)
Follow up in 1 year or as needed We'll notify you of your lab results and make any changes if needed Continue to work on healthy diet and regular exercise- you can do it! Call with any questions or concerns Hang in there!!! 

## 2020-02-20 NOTE — Assessment & Plan Note (Signed)
Pt's PE WNL w/ exception of obesity.  UTD on immunizations.  Check labs.  Anticipatory guidance provided.  

## 2020-02-21 ENCOUNTER — Encounter: Payer: Self-pay | Admitting: General Practice

## 2020-02-21 LAB — URINE CULTURE
MICRO NUMBER:: 10737531
Result:: NO GROWTH
SPECIMEN QUALITY:: ADEQUATE

## 2020-02-24 ENCOUNTER — Ambulatory Visit
Admission: RE | Admit: 2020-02-24 | Discharge: 2020-02-24 | Disposition: A | Discharge status: Home / Self Care | Payer: BC Managed Care – PPO | Source: Ambulatory Visit

## 2020-02-24 DIAGNOSIS — J069 Acute upper respiratory infection, unspecified: Secondary | ICD-10-CM

## 2020-02-24 MED ORDER — BENZONATATE 100 MG PO CAPS: 100.0000 mg | ORAL_CAPSULE | Freq: Three times a day (TID) | ORAL | 0 refills | Status: DC | PRN

## 2020-02-24 NOTE — ED Provider Notes (Signed)
Virtual Visit via Video Note:  Jack Peterson  initiated request for Telemedicine visit with [facility] team. I connected with Jack Peterson  on 02/24/2020 at 11:35 AM  for a synchronized telemedicine visit using a video enabled HIPPA compliant telemedicine application. I verified that I am speaking with Jack Peterson  using two identifiers. Mickie Bail, NP  was physically located in a [facility] Urgent care site and Jack Peterson was located at a different location.   The limitations of evaluation and management by telemedicine as well as the availability of in-person appointments were discussed. Patient was informed that he  may incur a bill ( including co-pay) for this virtual visit encounter. Jack Peterson  expressed understanding and gave verbal consent to proceed with virtual visit.     History of Present Illness:Jack Peterson  is a 39 y.o. male presents for evaluation of 5 day history of nonproductive cough, nasal congestion, and body aches.  He denies fever, chills, sore throat, ear pain, shortness of breath, vomiting, diarrhea, rash, or other symptoms.  Treatment attempted at home with Sudafed and ibuprofen.  He reports being fully vaccinated for COVID.   No Known Allergies   Past Medical History:  Diagnosis Date   GERD (gastroesophageal reflux disease)    History of nephrolithiasis      Social History   Tobacco Use   Smoking status: Former Smoker   Smokeless tobacco: Never Used  Building services engineer Use: Never used  Substance Use Topics   Alcohol use: Yes    Alcohol/week: 2.0 - 3.0 standard drinks    Types: 2 - 3 Standard drinks or equivalent per week   Drug use: No    ROS: as stated in HPI.  All other systems reviewed and negative.      Observations/Objective: Physical Exam  VITALS: Patient denies fever. GENERAL: Alert, appears well and in no acute distress. HEENT: Atraumatic. Oral mucosa appears moist. NECK: Normal  movements of the head and neck. CARDIOPULMONARY: No increased WOB. Speaking in clear sentences. I:E ratio WNL. Occasional nonproductive cough during visit.  MS: Moves all visible extremities without noticeable abnormality. PSYCH: Pleasant and cooperative, well-groomed. Speech normal rate and rhythm. Affect is appropriate. Insight and judgement are appropriate. Attention is focused, linear, and appropriate.  NEURO: CN grossly intact. Oriented as arrived to appointment on time with no prompting. Moves both UE equally.  SKIN: No obvious lesions, wounds, erythema, or cyanosis noted on face or hands.   Assessment and Plan:    ICD-10-CM   1. Upper respiratory tract infection, unspecified type  J06.9        Follow Up Instructions: Treating cough with Tessalon Perles.  Instructed patient to stop taking the Sudafed and start plain OTC Mucinex.  Instructed him to continue taking ibuprofen as needed.  Discussed that he should consider having a COVID test; he declines at this time.  Instructed him to follow-up with his PCP or come here to be seen in person if his symptoms are not improving.  Patient agrees to plan of care.    I discussed the assessment and treatment plan with the patient. The patient was provided an opportunity to ask questions and all were answered. The patient agreed with the plan and demonstrated an understanding of the instructions.   The patient was advised to call back or seek an in-person evaluation if the symptoms worsen or if the condition fails to improve as anticipated.  Mickie Bail, NP  02/24/2020 11:35 AM         Mickie Bail, NP 02/24/20 1135

## 2020-02-24 NOTE — Discharge Instructions (Addendum)
[facility] as [facility] cough.  Take Mucinex and ibuprofen as needed.    Follow up with your primary care provider or come here to be seen in person if your symptoms are not improving.

## 2020-03-02 ENCOUNTER — Encounter: Payer: Self-pay | Admitting: Family Medicine

## 2020-09-10 ENCOUNTER — Ambulatory Visit: Payer: BC Managed Care – PPO | Admitting: Family Medicine

## 2020-09-21 ENCOUNTER — Other Ambulatory Visit: Payer: Self-pay

## 2020-09-21 ENCOUNTER — Ambulatory Visit (INDEPENDENT_AMBULATORY_CARE_PROVIDER_SITE_OTHER): Payer: Self-pay | Admitting: Family Medicine

## 2020-09-21 ENCOUNTER — Encounter: Payer: Self-pay | Admitting: Family Medicine

## 2020-09-21 VITALS — BP 122/78 | HR 74 | Temp 99.1°F | Resp 20 | Ht 71.0 in | Wt 329.0 lb

## 2020-09-21 DIAGNOSIS — Z87442 Personal history of urinary calculi: Secondary | ICD-10-CM

## 2020-09-21 DIAGNOSIS — R319 Hematuria, unspecified: Secondary | ICD-10-CM

## 2020-09-21 DIAGNOSIS — R1032 Left lower quadrant pain: Secondary | ICD-10-CM

## 2020-09-21 LAB — POCT URINALYSIS DIPSTICK OB
Bilirubin, UA: NEGATIVE
Glucose, UA: NEGATIVE
Ketones, UA: NEGATIVE
Leukocytes, UA: NEGATIVE
Nitrite, UA: NEGATIVE
Spec Grav, UA: >=1.03 — AB (ref 1.010–1.025)
Urobilinogen, UA: 0.2 E.U./dL
pH, UA: 6 (ref 5.0–8.0)

## 2020-09-21 MED ORDER — CIPROFLOXACIN HCL 500 MG PO TABS: 500.0000 mg | ORAL_TABLET | Freq: Two times a day (BID) | ORAL | 0 refills | Status: DC

## 2020-09-21 MED ORDER — TAMSULOSIN HCL 0.4 MG PO CAPS: 0.4000 mg | ORAL_CAPSULE | Freq: Every day | ORAL | 3 refills | Status: DC

## 2020-09-21 NOTE — Patient Instructions (Signed)
Please call back with your insurance information ASAP so we can authorize your CT scan We'll notify you of your CT results and make any changes if needed Drink LOTS of water START the Cipro twice daily- take w/ food START the Flomax to open up the tubules and hopefully allow any stone to pass Call with any questions or concerns Hang in there!

## 2020-09-21 NOTE — Progress Notes (Signed)
   Subjective:    Patient ID: Jack Peterson, male    DOB: August 30, 1980, 40 y.o.   MRN: [id number]  HPI Lower abdominal pain- pt reports pain will radiate from waistband down into groin.  When he had a drug test for work there was blood in urine.  sxs started ~2-3 weeks ago.  Pt has known issues w/ back so he has a hard time distinguishing back from kidney pain.  Pt has hx of kidney stones.  No visible blood.  Has noticed that urine is dark in color and has 'a nasty odor to it'.  Intermittent dysuria.  Denies urinary frequency.  No N/V.  No swelling or bulge in testicle or groin area.   Review of Systems For ROS see HPI   This visit occurred during the SARS-CoV-2 public health emergency.  Safety protocols were in place, including screening questions prior to the visit, additional usage of staff PPE, and extensive cleaning of exam room while observing appropriate contact time as indicated for disinfecting solutions.       Objective:   Physical Exam Vitals reviewed.  Constitutional:      General: He is not in acute distress.    Appearance: He is well-developed. He is obese. He is not ill-appearing.  HENT:     Head: Normocephalic and atraumatic.  Abdominal:     Tenderness: There is abdominal tenderness in the left lower quadrant. There is no right CVA tenderness, left CVA tenderness, guarding or rebound.     Hernia: There is no hernia in the ventral area, left inguinal area or left femoral area.  Genitourinary:    Testes:        Left: Tenderness not present.  Skin:    General: Skin is warm and dry.  Neurological:     General: No focal deficit present.     Mental Status: He is alert and oriented to person, place, and time.  Psychiatric:        Mood and Affect: Mood normal.        Behavior: Behavior normal.           Assessment & Plan:  Hematuria/LLQ pain/Hx of stone- new to provider, recurrent issue for pt.  Sxs started 2-3 weeks ago.  He states it may have started w/ back  pain but due to his DDD he has a hard time distinguishing.  Now having LLQ pain, microscopic hematuria, urinary odor, some dysuria.  Start Cipro for possible infxn.  Get stone study to assess if urology is needed as he has required lithotripsy previously.  Start Flomax.  Pt is to let me know if pain worsens so we can provide medication if needed.  Pt expressed understanding and is in agreement w/ plan.

## 2020-09-23 LAB — URINE CULTURE
MICRO NUMBER:: 11559636
Result:: NO GROWTH
SPECIMEN QUALITY:: ADEQUATE

## 2020-10-28 ENCOUNTER — Ambulatory Visit (HOSPITAL_BASED_OUTPATIENT_CLINIC_OR_DEPARTMENT_OTHER)
Admission: RE | Admit: 2020-10-28 | Discharge: 2020-10-28 | Disposition: A | Discharge status: Home / Self Care | Payer: BC Managed Care – PPO | Source: Ambulatory Visit | Attending: Family Medicine | Admitting: Family Medicine

## 2020-10-28 ENCOUNTER — Other Ambulatory Visit: Payer: Self-pay

## 2020-10-28 ENCOUNTER — Telehealth: Payer: Self-pay | Admitting: Family Medicine

## 2020-10-28 DIAGNOSIS — Z87442 Personal history of urinary calculi: Secondary | ICD-10-CM | POA: Diagnosis not present

## 2020-10-28 DIAGNOSIS — N200 Calculus of kidney: Secondary | ICD-10-CM | POA: Diagnosis not present

## 2020-10-28 DIAGNOSIS — R319 Hematuria, unspecified: Secondary | ICD-10-CM | POA: Insufficient documentation

## 2020-10-28 DIAGNOSIS — R1032 Left lower quadrant pain: Secondary | ICD-10-CM | POA: Diagnosis not present

## 2020-10-28 DIAGNOSIS — K8020 Calculus of gallbladder without cholecystitis without obstruction: Secondary | ICD-10-CM | POA: Diagnosis not present

## 2020-10-28 DIAGNOSIS — M47819 Spondylosis without myelopathy or radiculopathy, site unspecified: Secondary | ICD-10-CM | POA: Diagnosis not present

## 2020-10-28 MED ORDER — TRAMADOL HCL 50 MG PO TABS
50.0000 mg | ORAL_TABLET | Freq: Three times a day (TID) | ORAL | 0 refills | Status: AC | PRN
Start: 2020-10-28 — End: 2020-11-02

## 2020-10-28 NOTE — Telephone Encounter (Signed)
Called to inform patient that per Dr. Beverely Low has sent tramadol to patient pharmacy to  help with his pain. Patient understood .No further concerns at this time.

## 2020-10-28 NOTE — Telephone Encounter (Signed)
Patient would like to know if you can send him something for pain. Patient did go to have CT scan done.

## 2020-10-28 NOTE — Telephone Encounter (Signed)
Pt called in asking if Dr. Beverely Low would be willing to call her in something for pain. He states that he went in today for the CT scan.  Pt uses [facility] on [address]

## 2020-10-28 NOTE — Telephone Encounter (Signed)
Prescription for Tramadol sent.  Please let pt know.

## 2020-10-29 ENCOUNTER — Encounter: Payer: Self-pay | Admitting: Family Medicine

## 2020-10-29 DIAGNOSIS — K8020 Calculus of gallbladder without cholecystitis without obstruction: Secondary | ICD-10-CM

## 2020-10-29 NOTE — Telephone Encounter (Signed)
Okay with gen surg referral pended below

## 2020-10-29 NOTE — Telephone Encounter (Signed)
Pt asking next steps please advise

## 2020-11-25 DIAGNOSIS — K8050 Calculus of bile duct without cholangitis or cholecystitis without obstruction: Secondary | ICD-10-CM | POA: Diagnosis not present

## 2020-11-27 ENCOUNTER — Other Ambulatory Visit: Payer: Self-pay | Admitting: Surgery

## 2020-11-27 DIAGNOSIS — K8050 Calculus of bile duct without cholangitis or cholecystitis without obstruction: Secondary | ICD-10-CM

## 2020-12-22 ENCOUNTER — Ambulatory Visit
Admission: RE | Admit: 2020-12-22 | Discharge: 2020-12-22 | Disposition: A | Discharge status: Home / Self Care | Payer: BC Managed Care – PPO | Source: Ambulatory Visit | Attending: Surgery | Admitting: Surgery

## 2020-12-22 DIAGNOSIS — K760 Fatty (change of) liver, not elsewhere classified: Secondary | ICD-10-CM | POA: Diagnosis not present

## 2020-12-22 DIAGNOSIS — K8050 Calculus of bile duct without cholangitis or cholecystitis without obstruction: Secondary | ICD-10-CM

## 2020-12-22 DIAGNOSIS — K8080 Other cholelithiasis without obstruction: Secondary | ICD-10-CM | POA: Diagnosis not present

## 2020-12-25 ENCOUNTER — Telehealth (INDEPENDENT_AMBULATORY_CARE_PROVIDER_SITE_OTHER): Payer: BC Managed Care – PPO | Admitting: Family Medicine

## 2020-12-25 ENCOUNTER — Other Ambulatory Visit: Payer: Self-pay

## 2020-12-25 ENCOUNTER — Encounter: Payer: Self-pay | Admitting: Family Medicine

## 2020-12-25 DIAGNOSIS — R058 Other specified cough: Secondary | ICD-10-CM

## 2020-12-25 MED ORDER — GUAIFENESIN-CODEINE 100-10 MG/5ML PO SYRP: 10.0000 mL | ORAL_SOLUTION | Freq: Three times a day (TID) | ORAL | 0 refills | Status: DC | PRN

## 2020-12-25 MED ORDER — PREDNISONE 10 MG PO TABS: ORAL_TABLET | ORAL | 0 refills | Status: DC

## 2020-12-25 NOTE — Progress Notes (Signed)
   Virtual Visit via Video   I connected with patient on 12/25/20 at 11:30 AM EDT by a video enabled telemedicine application and verified that I am speaking with the correct person using two identifiers.  Location patient: Home Location provider: [place] [facility], Office Persons participating in the virtual visit: Patient, Provider, CMA (Sabrina M)  I discussed the limitations of evaluation and management by telemedicine and the availability of in person appointments. The patient expressed understanding and agreed to proceed.  Subjective:   HPI:   URI- sxs started ~1 week ago.  Started w/ sore throat, body aches, chills.  Thought he had COVID again.  Took home test- negative.  Took another test on Monday b/c he had to go to work- negative.  Continues to cough and 'throat is really super raw'.  Has gone through Mucinex and Robitussin w/o relief.  Body aches have improved.  No current fevers.  Had facial pain and jaw pain 2 days ago but this seems to have improved.  + excessive congestion.  + hacking cough- intermittently productive.  ROS:   See pertinent positives and negatives per HPI.  Patient Active Problem List   Diagnosis Date Noted  . Morbid obesity (HCC) 02/20/2020  . BRBPR (bright red blood per rectum) 09/12/2014  . Routine general medical examination at a health care facility 08/23/2012  . GERD 03/26/2010  . BACK PAIN 03/26/2010  . NEPHROLITHIASIS, HX OF 03/26/2010    Social History   Tobacco Use  . Smoking status: Former Games developer  . Smokeless tobacco: Never Used  Substance Use Topics  . Alcohol use: Yes    Alcohol/week: 2.0 - 3.0 standard drinks    Types: 2 - 3 Standard drinks or equivalent per week    Current Outpatient Medications:  .  benzonatate (TESSALON) 100 MG capsule, Take 1 capsule (100 mg total) by mouth 3 (three) times daily as needed for cough. (Patient not taking: No sig reported), Disp: 21 capsule, Rfl: 0 .  ciprofloxacin (CIPRO) 500 MG tablet,  Take 1 tablet (500 mg total) by mouth 2 (two) times daily. (Patient not taking: Reported on 12/25/2020), Disp: 14 tablet, Rfl: 0 .  sildenafil (REVATIO) 20 MG tablet, Take 1 tablet (20 mg total) by mouth as needed. For erectile dysfunction. No more than 1 dose in 24 hours. (Patient not taking: No sig reported), Disp: 10 tablet, Rfl: 0 .  tamsulosin (FLOMAX) 0.4 MG CAPS capsule, Take 1 capsule (0.4 mg total) by mouth daily. (Patient not taking: Reported on 12/25/2020), Disp: 30 capsule, Rfl: 3  No Known Allergies  Objective:   There were no vitals taken for this visit. AAOx3, NAD NCAT, EOMI No obvious CN deficits Audible nasal congestion + hacking cough Pt is able to speak clearly, coherently without shortness of breath or increased work of breathing.  Thought process is linear.  Mood is appropriate.   Assessment and Plan:   Post-viral cough- pt reports feeling much better than he has been but continues to struggle w/ hacking cough and sore throat.  'I think i'm going to cough a hole in it'.  Will start Prednisone to improve airway and nasal/sinus inflammation and add Cheratussin as needed.  Reviewed supportive care and red flags that should prompt return.  Pt expressed understanding and is in agreement w/ plan.   Neena Rhymes, MD 12/25/2020

## 2020-12-25 NOTE — Progress Notes (Signed)
I connected with  Jack Peterson on 12/25/20 by a video enabled telemedicine application and verified that I am speaking with the correct person using two identifiers.   I discussed the limitations of evaluation and management by telemedicine. The patient expressed understanding and agreed to proceed.

## 2020-12-29 ENCOUNTER — Encounter: Payer: Self-pay | Admitting: Family Medicine

## 2021-01-27 ENCOUNTER — Encounter: Payer: Self-pay | Admitting: *Deleted

## 2021-02-03 ENCOUNTER — Encounter (HOSPITAL_BASED_OUTPATIENT_CLINIC_OR_DEPARTMENT_OTHER): Payer: Self-pay | Admitting: *Deleted

## 2021-02-03 ENCOUNTER — Other Ambulatory Visit: Payer: Self-pay

## 2021-02-03 ENCOUNTER — Emergency Department (HOSPITAL_BASED_OUTPATIENT_CLINIC_OR_DEPARTMENT_OTHER)
Admission: EM | Admit: 2021-02-03 | Discharge: 2021-02-03 | Disposition: A | Discharge status: Home / Self Care | Payer: BC Managed Care – PPO | Attending: Emergency Medicine | Admitting: Emergency Medicine

## 2021-02-03 DIAGNOSIS — M545 Low back pain, unspecified: Secondary | ICD-10-CM | POA: Diagnosis not present

## 2021-02-03 DIAGNOSIS — Z87891 Personal history of nicotine dependence: Secondary | ICD-10-CM | POA: Insufficient documentation

## 2021-02-03 DIAGNOSIS — M549 Dorsalgia, unspecified: Secondary | ICD-10-CM | POA: Diagnosis not present

## 2021-02-03 DIAGNOSIS — M5459 Other low back pain: Secondary | ICD-10-CM | POA: Diagnosis not present

## 2021-02-03 MED ORDER — PREDNISONE 50 MG PO TABS: ORAL_TABLET | ORAL | 0 refills | Status: DC

## 2021-02-03 MED ORDER — KETOROLAC TROMETHAMINE 60 MG/2ML IM SOLN
60.0000 mg | Freq: Once | INTRAMUSCULAR | Status: AC
  Administered 2021-02-03: 60 mg via INTRAMUSCULAR
  Filled 2021-02-03: qty 2

## 2021-02-03 MED ORDER — HYDROCODONE-ACETAMINOPHEN 5-325 MG PO TABS: 1.0000 | ORAL_TABLET | ORAL | 0 refills | Status: DC | PRN

## 2021-02-03 MED ORDER — HYDROMORPHONE HCL 1 MG/ML IJ SOLN
1.0000 mg | Freq: Once | INTRAMUSCULAR | Status: AC
Start: 2021-02-03 — End: 2021-02-03
  Administered 2021-02-03: 1 mg via INTRAMUSCULAR
  Filled 2021-02-03: qty 1

## 2021-02-03 MED ORDER — METHYLPREDNISOLONE SODIUM SUCC 125 MG IJ SOLR
125.0000 mg | Freq: Once | INTRAMUSCULAR | Status: AC
  Administered 2021-02-03: 125 mg via INTRAMUSCULAR
  Filled 2021-02-03: qty 2

## 2021-02-03 NOTE — ED Triage Notes (Signed)
Brought in by EMS from home c/o chronic back pain amb form EMS bay to triage

## 2021-02-03 NOTE — ED Provider Notes (Signed)
[facility] EMERGENCY DEPARTMENT Provider Note   CSN: [id number] Arrival date & time: 02/03/21  1453     History Chief Complaint  Patient presents with   Back Pain    Chronic     Jack Peterson is a 40 y.o. male.  The history is provided by the patient. No language interpreter was used.  Back Pain Location:  Lumbar spine Quality:  Aching Radiates to:  Does not radiate Pain severity:  Moderate Onset quality:  Gradual Duration:  3 days Timing:  Constant Progression:  Worsening Chronicity:  New Relieved by:  Nothing Worsened by:  Nothing Ineffective treatments:  None tried Associated symptoms: no abdominal pain and no fever   Risk factors: no recent surgery   Pt reports he has chronic back problems from degenerative disc disease.  Pt reports pain with standing today.  Pt reports he could not walk due to pain.      Past Medical History:  Diagnosis Date   GERD (gastroesophageal reflux disease)    History of nephrolithiasis     Patient Active Problem List   Diagnosis Date Noted   Morbid obesity (HCC) 02/20/2020   BRBPR (bright red blood per rectum) 09/12/2014   Routine general medical examination at a health care facility 08/23/2012   GERD 03/26/2010   BACK PAIN 03/26/2010   NEPHROLITHIASIS, HX OF 03/26/2010    Past Surgical History:  Procedure Laterality Date   ESOPHAGEAL DILATION     TONSILLECTOMY         Family History  Problem Relation Age of Onset   Hypertension Father    Cancer Father        prostate   Hypertension Maternal Grandmother    Cancer Paternal Grandfather        prostate   Cancer Paternal Great-grandfather        prostate   Diabetes Neg Hx    Stroke Neg Hx     Social History   Tobacco Use   Smoking status: Former    Pack years: 0.00   Smokeless tobacco: Never  Vaping Use   Vaping Use: Never used  Substance Use Topics   Alcohol use: Yes    Alcohol/week: 2.0 - 3.0 standard drinks    Types: 2 - 3 Standard drinks  or equivalent per week   Drug use: No    Home Medications Prior to Admission medications   Medication Sig Start Date End Date Taking? Authorizing Provider  HYDROcodone-acetaminophen (NORCO/VICODIN) 5-325 MG tablet Take 1 tablet by mouth every 4 (four) hours as needed for moderate pain. 02/03/21 02/03/22 Yes Elson Areas, PA-C  predniSONE (DELTASONE) 50 MG tablet One a day for 6 days 02/03/21  Yes Elson Areas, PA-C  guaiFENesin-codeine (ROBITUSSIN AC) 100-10 MG/5ML syrup Take 10 mLs by mouth 3 (three) times daily as needed for cough. 12/25/20   Sheliah Hatch, MD    Allergies    Patient has no known allergies.  Review of Systems   Review of Systems  Constitutional:  Negative for fever.  Gastrointestinal:  Negative for abdominal pain.  Musculoskeletal:  Positive for back pain.  All other systems reviewed and are negative.  Physical Exam Updated Vital Signs BP (!) 144/95   Pulse 74   Temp 97.8 F (36.6 C) (Oral)   Resp 16   Ht 5\' 11"  (1.803 m)   Wt (!) 140.6 kg   SpO2 96%   BMI 43.24 kg/m   Physical Exam Vitals and nursing note reviewed.  Constitutional:      Appearance: He is well-developed.  HENT:     Head: Normocephalic and atraumatic.  Eyes:     Conjunctiva/sclera: Conjunctivae normal.  Cardiovascular:     Rate and Rhythm: Normal rate and regular rhythm.     Heart sounds: No murmur heard. Pulmonary:     Effort: Pulmonary effort is normal. No respiratory distress.     Breath sounds: Normal breath sounds.  Abdominal:     Palpations: Abdomen is soft.     Tenderness: There is no abdominal tenderness.  Musculoskeletal:        General: Normal range of motion.     Cervical back: Neck supple.  Skin:    General: Skin is warm and dry.  Neurological:     General: No focal deficit present.     Mental Status: He is alert.    ED Results / Procedures / Treatments   Labs (all labs ordered are listed, but only abnormal results are displayed) Labs Reviewed - No  data to display  EKG None  Radiology No results found.  Procedures Procedures   Medications Ordered in ED Medications  methylPREDNISolone sodium succinate (SOLU-MEDROL) 125 mg/2 mL injection 125 mg (125 mg Intramuscular Given 02/03/21 1737)  ketorolac (TORADOL) injection 60 mg (60 mg Intramuscular Given 02/03/21 1729)  HYDROmorphone (DILAUDID) injection 1 mg (1 mg Intramuscular Given 02/03/21 1730)    ED Course  I have reviewed the triage vital signs and the nursing notes.  Pertinent labs & imaging results that were available during my care of the patient were reviewed by me and considered in my medical decision making (see chart for details).    MDM Rules/Calculators/A&P                          MDM:  Pt given toradol, solumedrol and dilaudid.  Pt reports some relief.  Pt able to stand and walk.  Pt given rx for prednisone and hydrocodone   Final Clinical Impression(s) / ED Diagnoses Final diagnoses:  Acute bilateral low back pain without sciatica    Rx / DC Orders ED Discharge Orders          Ordered    HYDROcodone-acetaminophen (NORCO/VICODIN) 5-325 MG tablet  Every 4 hours PRN        02/03/21 1851    predniSONE (DELTASONE) 50 MG tablet        02/03/21 1851          An After Visit Summary was printed and given to the patient.    Elson Areas, [place] 02/03/21 1932    Terrilee Files, MD 02/04/21 [phone]

## 2021-02-03 NOTE — Discharge Instructions (Addendum)
See your Physicain for recheck  °

## 2021-02-03 NOTE — ED Notes (Signed)
Pt teaching provided on medications that may cause drowsiness. Pt instructed not to drive or operate heavy machinery while taking the prescribed medication. Pt verbalized understanding.  ? ?Pt provided discharge instructions and prescription information. Pt was given the opportunity to ask questions and questions were answered. Discharge signature not obtained in the setting of the COVID-19 pandemic in order to reduce high touch surfaces.  ? ?

## 2021-02-23 NOTE — Pre-Procedure Instructions (Signed)
Surgical Instructions    Your procedure is scheduled on Wednesday, August 3rd, 2022.  Report to [facility] Main Entrance "A" at 06:30 A.M., then check in with the Admitting office.  Call this number if you have problems the morning of surgery:  [phone]   If you have any questions prior to your surgery date call [phone]: Open Monday-Friday 8am-4pm    Remember:  Do not eat after midnight the night before your surgery  You may drink clear liquids until 05:30 A.M. the morning of your surgery.   Clear liquids allowed are: Water, Non-Citrus Juices (without pulp), Carbonated Beverages, Clear Tea, Black Coffee Only, and Gatorade    Take these medicines the morning of surgery with A SIP OF WATER AS NEEDED: HYDROcodone-acetaminophen (NORCO/VICODIN)   As of today, STOP taking any Aspirin (unless otherwise instructed by your surgeon) Aleve, Naproxen, Ibuprofen, Motrin, Advil, Goody's, BC's, all herbal medications, fish oil, and all vitamins.          Do not wear jewelry Do not wear lotions, powders, colognes, or deodorant. Men may shave face and neck. Do not bring valuables to the hospital - [facility] is not responsible for any belongings or valuables.  Do NOT Smoke (Tobacco/Vaping) or drink Alcohol 24 hours prior to your procedure If you use a CPAP at night, you may bring all equipment for your overnight stay.   Contacts, glasses, dentures or bridgework may not be worn into surgery, please bring cases for these belongings   For patients admitted to the hospital, discharge time will be determined by your treatment team.   Patients discharged the day of surgery will not be allowed to drive home, and someone needs to stay with them for 24 hours.  ONLY 1 SUPPORT PERSON MAY BE PRESENT WHILE YOU ARE IN SURGERY. IF YOU ARE TO BE ADMITTED ONCE YOU ARE IN YOUR ROOM YOU WILL BE ALLOWED TWO (2) VISITORS.  Minor children may have two parents present. Special consideration for safety and  communication needs will be reviewed on a case by case basis.  Special instructions:    Oral Hygiene is also important to reduce your risk of infection.  Remember - BRUSH YOUR TEETH THE MORNING OF SURGERY WITH YOUR REGULAR TOOTHPASTE   [place]- Preparing For Surgery  Before surgery, you can play an important role. Because skin is not sterile, your skin needs to be as free of germs as possible. You can reduce the number of germs on your skin by washing with CHG (chlorahexidine gluconate) Soap before surgery.  CHG is an antiseptic cleaner which kills germs and bonds with the skin to continue killing germs even after washing.     Please do not use if you have an allergy to CHG or antibacterial soaps. If your skin becomes reddened/irritated stop using the CHG.  Do not shave (including legs and underarms) for at least 48 hours prior to first CHG shower. It is OK to shave your face.  Please follow these instructions carefully.     Shower the NIGHT BEFORE SURGERY and the MORNING OF SURGERY with CHG Soap.   If you chose to wash your hair, wash your hair first as usual with your normal shampoo. After you shampoo, rinse your hair and body thoroughly to remove the shampoo.  Then [employer] and genitals (private parts) with your normal soap and rinse thoroughly to remove soap.  After that Use CHG Soap as you would any other liquid soap. You can apply CHG directly  to the skin and wash gently with a scrungie or a clean washcloth.   Apply the CHG Soap to your body ONLY FROM THE NECK DOWN.  Do not use on open wounds or open sores. Avoid contact with your eyes, ears, mouth and genitals (private parts). Wash Face and genitals (private parts)  with your normal soap.   Wash thoroughly, paying special attention to the area where your surgery will be performed.  Thoroughly rinse your body with warm water from the neck down.  DO NOT shower/wash with your normal soap after using and rinsing off the CHG  Soap.  Pat yourself dry with a CLEAN TOWEL.  Wear CLEAN PAJAMAS to bed the night before surgery  Place CLEAN SHEETS on your bed the night before your surgery  DO NOT SLEEP WITH PETS.   Day of Surgery:  Take a shower with CHG soap. Wear Clean/Comfortable clothing the morning of surgery Do not apply any deodorants/lotions.   Remember to brush your teeth WITH YOUR REGULAR TOOTHPASTE.   Please read over the following fact sheets that you were given.

## 2021-02-24 ENCOUNTER — Other Ambulatory Visit: Payer: Self-pay

## 2021-02-24 ENCOUNTER — Ambulatory Visit: Payer: Self-pay | Admitting: Surgery

## 2021-02-24 ENCOUNTER — Encounter (HOSPITAL_COMMUNITY)
Admission: RE | Admit: 2021-02-24 | Discharge: 2021-02-24 | Disposition: A | Discharge status: Home / Self Care | Payer: BC Managed Care – PPO | Source: Ambulatory Visit | Attending: Surgery | Admitting: Surgery

## 2021-02-24 ENCOUNTER — Encounter (HOSPITAL_COMMUNITY): Payer: Self-pay

## 2021-02-24 DIAGNOSIS — Z01812 Encounter for preprocedural laboratory examination: Secondary | ICD-10-CM | POA: Diagnosis not present

## 2021-02-24 HISTORY — DX: Personal history of urinary calculi: Z87.442

## 2021-02-24 LAB — CBC
HCT: 44.5 % (ref 39.0–52.0)
Hemoglobin: 14.9 g/dL (ref 13.0–17.0)
MCH: 29.4 pg (ref 26.0–34.0)
MCHC: 33.5 g/dL (ref 30.0–36.0)
MCV: 87.9 fL (ref 80.0–100.0)
Platelets: 231 10*3/uL (ref 150–400)
RBC: 5.06 MIL/uL (ref 4.22–5.81)
RDW: 12.2 % (ref 11.5–15.5)
WBC: 6.4 10*3/uL (ref 4.0–10.5)
nRBC: 0 % (ref 0.0–0.2)

## 2021-02-24 NOTE — Pre-Procedure Instructions (Signed)
Surgical Instructions    Your procedure is scheduled on Wednesday, August 3rd, 2022.  Report to [facility] Main Entrance "A" at 06:30 A.M., then check in with the Admitting office.  Call this number if you have problems the morning of surgery:  [phone]   If you have any questions prior to your surgery date call [phone]: Open Monday-Friday 8am-4pm    Remember:  Do not eat or drink after midnight the night before your surgery     Take these medicines the morning of surgery with A SIP OF WATER AS NEEDED:  fluticasone (FLONASE)- If needed    As of today, STOP taking any Aspirin (unless otherwise instructed by your surgeon) Aleve, Naproxen, Ibuprofen, Motrin, Advil, Goody's, BC's, all herbal medications, fish oil, and all vitamins.          Do not wear jewelry Do not wear lotions, powders, colognes, or deodorant. Men may shave face and neck. Do not bring valuables to the hospital - [facility] is not responsible for any belongings or valuables.  Do NOT Smoke (Tobacco/Vaping) or drink Alcohol 24 hours prior to your procedure If you use a CPAP at night, you may bring all equipment for your overnight stay.   Contacts, glasses, dentures or bridgework may not be worn into surgery, please bring cases for these belongings   For patients admitted to the hospital, discharge time will be determined by your treatment team.   Patients discharged the day of surgery will not be allowed to drive home, and someone needs to stay with them for 24 hours.  ONLY 1 SUPPORT PERSON MAY BE PRESENT WHILE YOU ARE IN SURGERY. IF YOU ARE TO BE ADMITTED ONCE YOU ARE IN YOUR ROOM YOU WILL BE ALLOWED TWO (2) VISITORS.  Minor children may have two parents present. Special consideration for safety and communication needs will be reviewed on a case by case basis.  Special instructions:    Oral Hygiene is also important to reduce your risk of infection.  Remember - BRUSH YOUR TEETH THE MORNING OF SURGERY  WITH YOUR REGULAR TOOTHPASTE   [place]- Preparing For Surgery  Before surgery, you can play an important role. Because skin is not sterile, your skin needs to be as free of germs as possible. You can reduce the number of germs on your skin by washing with CHG (chlorahexidine gluconate) Soap before surgery.  CHG is an antiseptic cleaner which kills germs and bonds with the skin to continue killing germs even after washing.     Please do not use if you have an allergy to CHG or antibacterial soaps. If your skin becomes reddened/irritated stop using the CHG.  Do not shave (including legs and underarms) for at least 48 hours prior to first CHG shower. It is OK to shave your face.  Please follow these instructions carefully.     Shower the NIGHT BEFORE SURGERY and the MORNING OF SURGERY with CHG Soap.   If you chose to wash your hair, wash your hair first as usual with your normal shampoo. After you shampoo, rinse your hair and body thoroughly to remove the shampoo.  Then [employer] and genitals (private parts) with your normal soap and rinse thoroughly to remove soap.  After that Use CHG Soap as you would any other liquid soap. You can apply CHG directly to the skin and wash gently with a scrungie or a clean washcloth.   Apply the CHG Soap to your body ONLY FROM THE NECK DOWN.  Do not use on open wounds or open sores. Avoid contact with your eyes, ears, mouth and genitals (private parts). Wash Face and genitals (private parts)  with your normal soap.   Wash thoroughly, paying special attention to the area where your surgery will be performed.  Thoroughly rinse your body with warm water from the neck down.  DO NOT shower/wash with your normal soap after using and rinsing off the CHG Soap.  Pat yourself dry with a CLEAN TOWEL.  Wear CLEAN PAJAMAS to bed the night before surgery  Place CLEAN SHEETS on your bed the night before your surgery  DO NOT SLEEP WITH PETS.   Day of  Surgery:  Take a shower with CHG soap. Wear Clean/Comfortable clothing the morning of surgery Do not apply any deodorants/lotions.   Remember to brush your teeth WITH YOUR REGULAR TOOTHPASTE.   Please read over the following fact sheets that you were given.

## 2021-02-24 NOTE — Progress Notes (Signed)
PCP - Dr. Beverely Low Cardiologist - denies  PPM/ICD - n/a Device Orders - n/a Rep Notified - n/a  Chest x-ray - n/a EKG - n/a Stress Test - denies ECHO - denies Cardiac Cath - denies  Sleep Study - denies CPAP - n/a  Fasting Blood Sugar - n/a Checks Blood Sugar _ n/a_ times a day  Blood Thinner Instructions: n/a Aspirin Instructions: n/a  ERAS Protcol - No. NPO after midnight PRE-SURGERY Ensure or G2- n/a  COVID TEST- No. Ambulatory Surgery   Anesthesia review: No  Patient denies shortness of breath, fever, cough and chest pain at PAT appointment   All instructions explained to the patient, with a verbal understanding of the material. Patient agrees to go over the instructions while at home for a better understanding. Patient also instructed to self quarantine after being tested for COVID-19. The opportunity to ask questions was provided.

## 2021-03-02 NOTE — Anesthesia Preprocedure Evaluation (Addendum)
Anesthesia Evaluation  Patient identified by MRN, date of birth, ID band Patient awake    Reviewed: Allergy & Precautions, NPO status , Patient's Chart, lab work & pertinent test results  History of Anesthesia Complications Negative for: history of anesthetic complications  Airway Mallampati: II  TM Distance: >3 FB Neck ROM: Full    Dental  (+) Missing,    Pulmonary former smoker,    Pulmonary exam normal        Cardiovascular negative cardio ROS Normal cardiovascular exam     Neuro/Psych negative neurological ROS  negative psych ROS   GI/Hepatic Neg liver ROS, GERD  Controlled,gallstones   Endo/Other  Morbid obesity (BMI 44)  Renal/GU negative Renal ROS  negative genitourinary   Musculoskeletal negative musculoskeletal ROS (+)   Abdominal   Peds  Hematology negative hematology ROS (+)   Anesthesia Other Findings Day of surgery medications reviewed with patient.  Reproductive/Obstetrics negative OB ROS                            Anesthesia Physical Anesthesia Plan  ASA: 3  Anesthesia Plan: General   Post-op Pain Management:    Induction: Intravenous  PONV Risk Score and Plan: 3 and Treatment may vary due to age or medical condition, Midazolam, Dexamethasone and Ondansetron  Airway Management Planned: Oral ETT  Additional Equipment: None  Intra-op Plan:   Post-operative Plan: Extubation in OR  Informed Consent: I have reviewed the patients History and Physical, chart, labs and discussed the procedure including the risks, benefits and alternatives for the proposed anesthesia with the patient or authorized representative who has indicated his/her understanding and acceptance.     Dental advisory given  Plan Discussed with: CRNA  Anesthesia Plan Comments:        Anesthesia Quick Evaluation

## 2021-03-03 ENCOUNTER — Encounter (HOSPITAL_COMMUNITY): Payer: Self-pay | Admitting: Surgery

## 2021-03-03 ENCOUNTER — Encounter (HOSPITAL_COMMUNITY)
Admission: RE | Disposition: A | Discharge status: Home / Self Care | Payer: Self-pay | Source: Home / Self Care | Attending: Surgery

## 2021-03-03 ENCOUNTER — Ambulatory Visit (HOSPITAL_COMMUNITY)
Admission: RE | Admit: 2021-03-03 | Discharge: 2021-03-03 | Disposition: A | Discharge status: Home / Self Care | Payer: BC Managed Care – PPO | Attending: Surgery | Admitting: Surgery

## 2021-03-03 ENCOUNTER — Ambulatory Visit (HOSPITAL_COMMUNITY): Payer: BC Managed Care – PPO | Admitting: Certified Registered"

## 2021-03-03 ENCOUNTER — Other Ambulatory Visit: Payer: Self-pay

## 2021-03-03 DIAGNOSIS — K828 Other specified diseases of gallbladder: Secondary | ICD-10-CM | POA: Diagnosis not present

## 2021-03-03 DIAGNOSIS — K8010 Calculus of gallbladder with chronic cholecystitis without obstruction: Secondary | ICD-10-CM | POA: Insufficient documentation

## 2021-03-03 DIAGNOSIS — K219 Gastro-esophageal reflux disease without esophagitis: Secondary | ICD-10-CM | POA: Diagnosis not present

## 2021-03-03 DIAGNOSIS — K8020 Calculus of gallbladder without cholecystitis without obstruction: Secondary | ICD-10-CM | POA: Diagnosis not present

## 2021-03-03 DIAGNOSIS — Z87891 Personal history of nicotine dependence: Secondary | ICD-10-CM | POA: Diagnosis not present

## 2021-03-03 HISTORY — PX: CHOLECYSTECTOMY: SHX55

## 2021-03-03 SURGERY — LAPAROSCOPIC CHOLECYSTECTOMY
Anesthesia: General | Site: Abdomen

## 2021-03-03 MED ORDER — MIDAZOLAM HCL 5 MG/5ML IJ SOLN
INTRAMUSCULAR | Status: DC | PRN
Start: 2021-03-03 — End: 2021-03-03
  Administered 2021-03-03: 2 mg via INTRAVENOUS

## 2021-03-03 MED ORDER — PROPOFOL 10 MG/ML IV BOLUS
INTRAVENOUS | Status: DC | PRN
  Administered 2021-03-03: 200 mg via INTRAVENOUS

## 2021-03-03 MED ORDER — FENTANYL CITRATE (PF) 250 MCG/5ML IJ SOLN
INTRAMUSCULAR | Status: DC | PRN
  Administered 2021-03-03 (×3): 50 ug via INTRAVENOUS
  Administered 2021-03-03: 100 ug via INTRAVENOUS

## 2021-03-03 MED ORDER — BUPIVACAINE-EPINEPHRINE (PF) 0.25% -1:200000 IJ SOLN
INTRAMUSCULAR | Status: AC
  Filled 2021-03-03: qty 30

## 2021-03-03 MED ORDER — CEFAZOLIN IN SODIUM CHLORIDE 3-0.9 GM/100ML-% IV SOLN
3.0000 g | INTRAVENOUS | Status: AC
  Administered 2021-03-03: 3 g via INTRAVENOUS
  Filled 2021-03-03: qty 100

## 2021-03-03 MED ORDER — FENTANYL CITRATE (PF) 250 MCG/5ML IJ SOLN
INTRAMUSCULAR | Status: AC
  Filled 2021-03-03: qty 5

## 2021-03-03 MED ORDER — OXYCODONE HCL 5 MG/5ML PO SOLN: 5.0000 mg | Freq: Once | ORAL | Status: AC | PRN

## 2021-03-03 MED ORDER — PROMETHAZINE HCL 25 MG/ML IJ SOLN: 6.2500 mg | INTRAMUSCULAR | Status: DC | PRN

## 2021-03-03 MED ORDER — LIDOCAINE 2% (20 MG/ML) 5 ML SYRINGE
INTRAMUSCULAR | Status: AC
  Filled 2021-03-03: qty 5

## 2021-03-03 MED ORDER — ONDANSETRON HCL 4 MG/2ML IJ SOLN
INTRAMUSCULAR | Status: DC | PRN
  Administered 2021-03-03: 4 mg via INTRAVENOUS

## 2021-03-03 MED ORDER — LACTATED RINGERS IV SOLN: INTRAVENOUS | Status: DC

## 2021-03-03 MED ORDER — OXYCODONE HCL 5 MG PO TABS
ORAL_TABLET | ORAL | Status: AC
  Administered 2021-03-03: 5 mg via ORAL
  Filled 2021-03-03: qty 1

## 2021-03-03 MED ORDER — PROPOFOL 10 MG/ML IV BOLUS
INTRAVENOUS | Status: AC
  Filled 2021-03-03: qty 40

## 2021-03-03 MED ORDER — ORAL CARE MOUTH RINSE: 15.0000 mL | Freq: Once | OROMUCOSAL | Status: AC

## 2021-03-03 MED ORDER — FENTANYL CITRATE (PF) 100 MCG/2ML IJ SOLN: 25.0000 ug | INTRAMUSCULAR | Status: DC | PRN

## 2021-03-03 MED ORDER — SUGAMMADEX SODIUM 500 MG/5ML IV SOLN
INTRAVENOUS | Status: DC | PRN
  Administered 2021-03-03: 500 mg via INTRAVENOUS

## 2021-03-03 MED ORDER — SODIUM CHLORIDE 0.9 % IR SOLN
Status: DC | PRN
  Administered 2021-03-03: 1000 mL

## 2021-03-03 MED ORDER — BUPIVACAINE-EPINEPHRINE 0.25% -1:200000 IJ SOLN
INTRAMUSCULAR | Status: DC | PRN
  Administered 2021-03-03: 12 mL
  Administered 2021-03-03: 18 mL

## 2021-03-03 MED ORDER — HYDROCODONE-ACETAMINOPHEN 5-325 MG PO TABS: 1.0000 | ORAL_TABLET | Freq: Four times a day (QID) | ORAL | 0 refills | Status: DC | PRN

## 2021-03-03 MED ORDER — ACETAMINOPHEN 500 MG PO TABS
1000.0000 mg | ORAL_TABLET | ORAL | Status: AC
  Administered 2021-03-03: 1000 mg via ORAL
  Filled 2021-03-03: qty 2

## 2021-03-03 MED ORDER — LIDOCAINE 2% (20 MG/ML) 5 ML SYRINGE
INTRAMUSCULAR | Status: DC | PRN
  Administered 2021-03-03: 100 mg via INTRAVENOUS

## 2021-03-03 MED ORDER — PHENYLEPHRINE HCL (PRESSORS) 10 MG/ML IV SOLN
INTRAVENOUS | Status: DC | PRN
  Administered 2021-03-03: 80 ug via INTRAVENOUS

## 2021-03-03 MED ORDER — GABAPENTIN 300 MG PO CAPS
300.0000 mg | ORAL_CAPSULE | ORAL | Status: AC
  Administered 2021-03-03: 300 mg via ORAL
  Filled 2021-03-03: qty 1

## 2021-03-03 MED ORDER — MIDAZOLAM HCL 2 MG/2ML IJ SOLN
INTRAMUSCULAR | Status: AC
  Filled 2021-03-03: qty 2

## 2021-03-03 MED ORDER — PHENYLEPHRINE 40 MCG/ML (10ML) SYRINGE FOR IV PUSH (FOR BLOOD PRESSURE SUPPORT)
PREFILLED_SYRINGE | INTRAVENOUS | Status: AC
  Filled 2021-03-03: qty 10

## 2021-03-03 MED ORDER — FENTANYL CITRATE (PF) 100 MCG/2ML IJ SOLN
INTRAMUSCULAR | Status: AC
  Administered 2021-03-03: 50 ug via INTRAVENOUS
  Filled 2021-03-03: qty 2

## 2021-03-03 MED ORDER — 0.9 % SODIUM CHLORIDE (POUR BTL) OPTIME
TOPICAL | Status: DC | PRN
  Administered 2021-03-03: 1000 mL

## 2021-03-03 MED ORDER — ROCURONIUM BROMIDE 10 MG/ML (PF) SYRINGE
PREFILLED_SYRINGE | INTRAVENOUS | Status: AC
  Filled 2021-03-03: qty 10

## 2021-03-03 MED ORDER — CHLORHEXIDINE GLUCONATE 0.12 % MT SOLN
15.0000 mL | Freq: Once | OROMUCOSAL | Status: AC
  Administered 2021-03-03: 15 mL via OROMUCOSAL
  Filled 2021-03-03: qty 15

## 2021-03-03 MED ORDER — DEXAMETHASONE SODIUM PHOSPHATE 10 MG/ML IJ SOLN
INTRAMUSCULAR | Status: DC | PRN
  Administered 2021-03-03: 10 mg via INTRAVENOUS

## 2021-03-03 MED ORDER — ROCURONIUM BROMIDE 100 MG/10ML IV SOLN
INTRAVENOUS | Status: DC | PRN
  Administered 2021-03-03: 70 mg via INTRAVENOUS

## 2021-03-03 MED ORDER — OXYCODONE HCL 5 MG PO TABS: 5.0000 mg | ORAL_TABLET | Freq: Once | ORAL | Status: AC | PRN

## 2021-03-03 SURGICAL SUPPLY — 44 items
APPLIER CLIP 5 13 M/L LIGAMAX5 (MISCELLANEOUS) ×2
BAG COUNTER SPONGE SURGICOUNT (BAG) ×2 IMPLANT
BAG SPEC RTRVL LRG 6X4 10 (ENDOMECHANICALS) ×1
BLADE CLIPPER SURG (BLADE) ×2 IMPLANT
CANISTER SUCT 3000ML PPV (MISCELLANEOUS) ×2 IMPLANT
CHLORAPREP W/TINT 26 (MISCELLANEOUS) ×2 IMPLANT
CLIP APPLIE 5 13 M/L LIGAMAX5 (MISCELLANEOUS) ×1 IMPLANT
COVER SURGICAL LIGHT HANDLE (MISCELLANEOUS) ×2 IMPLANT
DERMABOND ADVANCED (GAUZE/BANDAGES/DRESSINGS) ×1
DERMABOND ADVANCED .7 DNX12 (GAUZE/BANDAGES/DRESSINGS) ×1 IMPLANT
ELECT REM PT RETURN 9FT ADLT (ELECTROSURGICAL) ×2
ELECTRODE REM PT RTRN 9FT ADLT (ELECTROSURGICAL) ×1 IMPLANT
GLOVE SURG POLY MICRO LF SZ5.5 (GLOVE) ×2 IMPLANT
GLOVE SURG UNDER POLY LF SZ6 (GLOVE) ×2 IMPLANT
GOWN STRL REUS W/ TWL LRG LVL3 (GOWN DISPOSABLE) ×4 IMPLANT
GOWN STRL REUS W/TWL LRG LVL3 (GOWN DISPOSABLE) ×8
GRASPER SUT TROCAR 14GX15 (MISCELLANEOUS) ×2 IMPLANT
KIT BASIN OR (CUSTOM PROCEDURE TRAY) ×2 IMPLANT
KIT TURNOVER KIT B (KITS) ×2 IMPLANT
L-HOOK LAP DISP 36CM (ELECTROSURGICAL) ×2
LHOOK LAP DISP 36CM (ELECTROSURGICAL) ×1 IMPLANT
NEEDLE INSUFFLATION 14GA 120MM (NEEDLE) ×2 IMPLANT
NS IRRIG 1000ML POUR BTL (IV SOLUTION) ×2 IMPLANT
PAD ARMBOARD 7.5X6 YLW CONV (MISCELLANEOUS) ×2 IMPLANT
PENCIL BUTTON HOLSTER BLD 10FT (ELECTRODE) ×2 IMPLANT
POUCH SPECIMEN RETRIEVAL 10MM (ENDOMECHANICALS) ×2 IMPLANT
SCISSORS LAP 5X35 DISP (ENDOMECHANICALS) ×2 IMPLANT
SET IRRIG TUBING LAPAROSCOPIC (IRRIGATION / IRRIGATOR) ×2 IMPLANT
SET TUBE SMOKE EVAC HIGH FLOW (TUBING) ×2 IMPLANT
SLEEVE ENDOPATH XCEL 5M (ENDOMECHANICALS) ×4 IMPLANT
SPECIMEN JAR SMALL (MISCELLANEOUS) ×2 IMPLANT
SPONGE T-LAP 18X18 ~~LOC~~+RFID (SPONGE) ×2 IMPLANT
SPONGE T-LAP 4X18 ~~LOC~~+RFID (SPONGE) ×2 IMPLANT
SUT MNCRL AB 4-0 PS2 18 (SUTURE) ×2 IMPLANT
SUT VIC AB 3-0 SH 27 (SUTURE)
SUT VIC AB 3-0 SH 27XBRD (SUTURE) IMPLANT
SUT VICRYL 0 UR6 27IN ABS (SUTURE) ×2 IMPLANT
TOWEL GREEN STERILE (TOWEL DISPOSABLE) ×2 IMPLANT
TOWEL GREEN STERILE FF (TOWEL DISPOSABLE) ×2 IMPLANT
TRAY LAPAROSCOPIC MC (CUSTOM PROCEDURE TRAY) ×2 IMPLANT
TROCAR XCEL 12X100 BLDLESS (ENDOMECHANICALS) ×4 IMPLANT
TROCAR XCEL BLUNT TIP 100MML (ENDOMECHANICALS) ×2 IMPLANT
TROCAR XCEL NON-BLD 5MMX100MML (ENDOMECHANICALS) ×2 IMPLANT
WATER STERILE IRR 1000ML POUR (IV SOLUTION) ×2 IMPLANT

## 2021-03-03 NOTE — H&P (Signed)
Jack Peterson is an 40 y.o. male.   Chief Complaint: abdominal pain HPI: Jack Peterson is a 40 yo male who was referred for biliary colic. He has been having abdominal pain recently, sometimes in the central abdomen with radiation to the LLQ, but he also reports frequent RUQ pain that occurs after eating. Fatty and greasy foods exacerbate the pain. He denies fevers, chills, nausea and jaundice. He has a history of kidney stones and was found to have hematuria on workup by his PCP, and was sent for a noncontrast CT scan. This showed a nonobstructing stone in the right kidney, as well as cholelithiasis. He was referred for cholecystectomy. RUQ [place] on 5/24 confirmed a large gallstone. He presents today for surgery.  Past Medical History:  Diagnosis Date   GERD (gastroesophageal reflux disease)    History of kidney stones    History of nephrolithiasis     Past Surgical History:  Procedure Laterality Date   ESOPHAGEAL DILATION     LITHOTRIPSY Left 2016   TONSILLECTOMY      Family History  Problem Relation Age of Onset   Hypertension Father    Cancer Father        prostate   Hypertension Maternal Grandmother    Cancer Paternal Grandfather        prostate   Cancer Paternal Great-grandfather        prostate   Diabetes Neg Hx    Stroke Neg Hx    Social History:  reports that he quit smoking about 19 months ago. His smoking use included cigarettes. He has never used smokeless tobacco. He reports current alcohol use of about 2.0 - 3.0 standard drinks of alcohol per week. He reports that he does not use drugs.  Allergies:  Allergies  Allergen Reactions   Tuna Oil [Fish Oil] Itching and Rash    Medications Prior to Admission  Medication Sig Dispense Refill   fluticasone (FLONASE) 50 MCG/ACT nasal spray Place 2 sprays into both nostrils daily as needed for allergies or rhinitis.     ibuprofen (ADVIL) 200 MG tablet Take 600 mg by mouth every 6 (six) hours as needed for mild pain or  moderate pain.     guaiFENesin-codeine (ROBITUSSIN AC) 100-10 MG/5ML syrup Take 10 mLs by mouth 3 (three) times daily as needed for cough. (Patient not taking: Reported on 02/23/2021) 120 mL 0    No results found for this or any previous visit (from the past 48 hour(s)). No results found.  Review of Systems  Constitutional:  Negative for chills and fever.  Respiratory:  Negative for wheezing and stridor.   Cardiovascular:  Negative for chest pain.  Neurological:  Negative for speech difficulty.  Psychiatric/Behavioral:  Negative for agitation and confusion.    Blood pressure (!) 152/83, pulse 68, temperature 98.6 F (37 C), temperature source Oral, resp. rate 18, height 5\' 11"  (1.803 m), weight (!) 140.6 kg, SpO2 96 %. Physical Exam Vitals reviewed.  Constitutional:      Appearance: Normal appearance.  HENT:     Head: Normocephalic and atraumatic.  Eyes:     Conjunctiva/sclera: Conjunctivae normal.  Pulmonary:     Effort: Pulmonary effort is normal. No respiratory distress.  Abdominal:     General: There is no distension.     Palpations: Abdomen is soft.     Tenderness: There is no abdominal tenderness.  Musculoskeletal:        General: Normal range of motion.     Cervical back:  Normal range of motion.  Skin:    General: Skin is warm and dry.  Neurological:     General: No focal deficit present.     Mental Status: He is alert and oriented to person, place, and time.  Psychiatric:        Mood and Affect: Mood normal.        Behavior: Behavior normal.        Thought Content: Thought content normal.     Assessment/Plan 40 yo male with symptomatic cholelithiasis. Proceed to OR for lap cholecystectomy. Informed consent obtained, postoperative instructions reviewed. All questions answered. Plan for discharge home from PACU.  Fritzi Mandes, MD 03/03/2021, 7:38 AM

## 2021-03-03 NOTE — Anesthesia Postprocedure Evaluation (Addendum)
Anesthesia Post Note  Patient: Jack Peterson  Procedure(s) Performed: LAPAROSCOPIC CHOLECYSTECTOMY (Abdomen)     Patient location during evaluation: PACU Anesthesia Type: General Level of consciousness: awake and alert and oriented Pain management: pain level controlled Vital Signs Assessment: post-procedure vital signs reviewed and stable Respiratory status: spontaneous breathing, nonlabored ventilation and respiratory function stable Cardiovascular status: blood pressure returned to baseline Postop Assessment: no apparent nausea or vomiting Anesthetic complications: no   No notable events documented.  Last Vitals:  Vitals:   03/03/21 1005 03/03/21 1010  BP: (!) 131/93   Pulse: 69 68  Resp: 15 12  Temp:  36.6 C  SpO2: 97% 98%    Last Pain:  Vitals:   03/03/21 1000  TempSrc:   PainSc: 5                  Kaylyn Layer

## 2021-03-03 NOTE — Op Note (Signed)
Date: 03/03/21  Patient: Jack Peterson MRN: [id number]  Preoperative Diagnosis: Symptomatic cholelithiasis Postoperative Diagnosis: Same  Procedure: Laparoscopic cholecystectomy  Surgeon: Sophronia Simas, MD Assistant: Hedda Slade, PA-C  EBL: Minimal  Anesthesia: General  Specimens: Gallbladder  Indications: [name] is a 40 yo male who presented with postprandial RUQ abdominal pain and was found to have cholelithiasis. After a discussion of the risks and benefits of surgery, he agreed to proceed with cholecystectomy.  Findings: Cholelithiasis, no evidence of acute cholecystitis.  Procedure details: Informed consent was obtained in the preoperative area prior to the procedure. The patient was brought to the operating room and placed on the table in the supine position. General anesthesia was induced and appropriate lines and drains were placed for intraoperative monitoring. Perioperative antibiotics were administered per SCIP guidelines. The abdomen was prepped and draped in the usual sterile fashion. A pre-procedure timeout was taken verifying patient identity, surgical site and procedure to be performed.  A small supraumbilical skin incision was made, the subcutaneous tissue was divided with cautery, and the umbilical stalk was grasped and elevated. A Veress needle was inserted, intraperitoneal placement was confirmed with the saline drop test, and the abdomen was insufflated. A 58mm trocar was placed and the peritoneal cavity was inspected with no evidence of visceral or vascular injury. Three 17mm ports were placed in the right subcostal margin, all under direct visualization. The fundus of the gallbladder was grasped and retracted cephalad. There were omental adhesions to the gallbladder, which were taken down with gentle blunt dissection. The infundibulum was retracted laterally. The cystic triangle was dissected out using cautery and blunt dissection, and the critical view of safety  was obtained. The cystic duct and cystic artery were clipped and ligated, leaving 2 clips on the cystic duct stump. The gallbladder was taken off the liver using cautery. The specimen was placed in an endocatch bag and removed. The surgical site was irrigated with saline until the effluent was clear. Hemostasis was achieved in the gallbladder fossa using cautery. The cystic duct and artery stumps were visually inspected and there was no evidence of bile leak or bleeding. The 52mm port site fascia was closed with a PMI using 0 Vicryl suture. The remaining ports were removed and the abdomen was desufflated. The port sites were closed with 4-0 monocryl subcuticular suture. Dermabond was applied.  The patient tolerated the procedure with no apparent complications. All counts were correct x2 at the end of the procedure. The patient was extubated and taken to PACU in stable condition.  Sophronia Simas, MD 03/03/21 9:38 AM

## 2021-03-03 NOTE — Discharge Instructions (Addendum)
[facility] DISCHARGE INSTRUCTIONS  Activity No heavy lifting greater than 15 pounds for 4 weeks after surgery. Ok to shower, but do not bathe or submerge incisions underwater. Do not drive while taking narcotic pain medication.  Wound Care Your incisions are covered with skin glue called Dermabond. This will peel off on its own over time. You may shower and allow warm soapy water to run over your incisions. Gently pat dry. Do not submerge your incision underwater. Monitor your incision for any new redness, tenderness, or drainage.  When to Call us: Fever greater than 100.5 New redness, drainage, or swelling at incision site Severe pain, nausea, or vomiting Jaundice (yellowing of the whites of the eyes or skin)  Follow-up You have an appointment scheduled with Dr. Freida Busman on March 23, 2021 at 11:45am. This will be at the [facility] Surgery office at [address], [place], [place]. Please arrive at least 15 minutes prior to your scheduled appointment time.  For questions or concerns, please call the office at [phone].

## 2021-03-03 NOTE — Transfer of Care (Signed)
Immediate Anesthesia Transfer of Care Note  Patient: Jack Peterson  Procedure(s) Performed: LAPAROSCOPIC CHOLECYSTECTOMY (Abdomen)  Patient Location: PACU  Anesthesia Type:General  Level of Consciousness: drowsy  Airway & Oxygen Therapy: Patient Spontanous Breathing and Patient connected to nasal cannula oxygen  Post-op Assessment: Report given to RN and Post -op Vital signs reviewed and stable  Post vital signs: Reviewed and stable  Last Vitals:  Vitals Value Taken Time  BP 144/95 03/03/21 0933  Temp    Pulse 63 03/03/21 0935  Resp 20 03/03/21 0935  SpO2 99 % 03/03/21 0935  Vitals shown include unvalidated device data.  Last Pain:  Vitals:   03/03/21 0723  TempSrc:   PainSc: 0-No pain         Complications: No notable events documented.

## 2021-03-03 NOTE — Anesthesia Procedure Notes (Addendum)
Procedure Name: Intubation Date/Time: 03/03/2021 8:29 AM Performed by: Erick Colace, RN Pre-anesthesia Checklist: Patient identified, Emergency Drugs available, Suction available, Timeout performed and Patient being monitored Patient Re-evaluated:Patient Re-evaluated prior to induction Oxygen Delivery Method: Circle system utilized Preoxygenation: Pre-oxygenation with 100% oxygen Induction Type: IV induction Ventilation: Mask ventilation without difficulty Laryngoscope Size: 4 and Mac Grade View: Grade II Tube type: Oral Tube size: 7.5 mm Number of attempts: 1 Airway Equipment and Method: Stylet Placement Confirmation: ETT inserted through vocal cords under direct vision, positive ETCO2, CO2 detector and breath sounds checked- equal and bilateral Secured at: 23 cm Tube secured with: Tape Dental Injury: Teeth and Oropharynx as per pre-operative assessment

## 2021-03-04 ENCOUNTER — Encounter (HOSPITAL_COMMUNITY): Payer: Self-pay | Admitting: Surgery

## 2021-03-04 LAB — SURGICAL PATHOLOGY

## 2021-03-16 DIAGNOSIS — M545 Low back pain, unspecified: Secondary | ICD-10-CM | POA: Diagnosis not present

## 2021-03-16 DIAGNOSIS — R030 Elevated blood-pressure reading, without diagnosis of hypertension: Secondary | ICD-10-CM | POA: Diagnosis not present

## 2021-03-16 DIAGNOSIS — Z6841 Body Mass Index (BMI) 40.0 and over, adult: Secondary | ICD-10-CM | POA: Diagnosis not present

## 2021-03-23 DIAGNOSIS — Z9049 Acquired absence of other specified parts of digestive tract: Secondary | ICD-10-CM | POA: Insufficient documentation

## 2021-08-17 DIAGNOSIS — J069 Acute upper respiratory infection, unspecified: Secondary | ICD-10-CM | POA: Diagnosis not present

## 2021-10-11 ENCOUNTER — Encounter: Payer: Self-pay | Admitting: Family Medicine

## 2021-10-11 ENCOUNTER — Ambulatory Visit (INDEPENDENT_AMBULATORY_CARE_PROVIDER_SITE_OTHER): Payer: BC Managed Care – PPO | Admitting: Family Medicine

## 2021-10-11 VITALS — BP 120/78 | HR 64 | Temp 98.1°F | Resp 16 | Ht 71.0 in | Wt 320.4 lb

## 2021-10-11 DIAGNOSIS — Z0000 Encounter for general adult medical examination without abnormal findings: Secondary | ICD-10-CM | POA: Diagnosis not present

## 2021-10-11 DIAGNOSIS — Z114 Encounter for screening for human immunodeficiency virus [HIV]: Secondary | ICD-10-CM

## 2021-10-11 DIAGNOSIS — Z1159 Encounter for screening for other viral diseases: Secondary | ICD-10-CM | POA: Diagnosis not present

## 2021-10-11 LAB — CBC WITH DIFFERENTIAL/PLATELET
Basophils Absolute: 0 K/uL (ref 0.0–0.1)
Basophils Relative: 0.9 % (ref 0.0–3.0)
Eosinophils Absolute: 0.2 K/uL (ref 0.0–0.7)
Eosinophils Relative: 4.7 % (ref 0.0–5.0)
HCT: 47.2 % (ref 39.0–52.0)
Hemoglobin: 15.9 g/dL (ref 13.0–17.0)
Lymphocytes Relative: 31.2 % (ref 12.0–46.0)
Lymphs Abs: 1.2 K/uL (ref 0.7–4.0)
MCHC: 33.6 g/dL (ref 30.0–36.0)
MCV: 88.6 fl (ref 78.0–100.0)
Monocytes Absolute: 0.5 K/uL (ref 0.1–1.0)
Monocytes Relative: 13.9 % — ABNORMAL HIGH (ref 3.0–12.0)
Neutro Abs: 1.9 K/uL (ref 1.4–7.7)
Neutrophils Relative %: 49.3 % (ref 43.0–77.0)
Platelets: 190 K/uL (ref 150.0–400.0)
RBC: 5.33 Mil/uL (ref 4.22–5.81)
RDW: 12.7 % (ref 11.5–15.5)
WBC: 3.9 K/uL — ABNORMAL LOW (ref 4.0–10.5)

## 2021-10-11 LAB — BASIC METABOLIC PANEL
BUN: 14 mg/dL (ref 6–23)
CO2: 27 mEq/L (ref 19–32)
Calcium: 9.1 mg/dL (ref 8.4–10.5)
Chloride: 104 mEq/L (ref 96–112)
Creatinine, Ser: 0.8 mg/dL (ref 0.40–1.50)
GFR: 109.24 mL/min (ref >60.00)
Glucose, Bld: 97 mg/dL (ref 70–99)
Potassium: 4.4 mEq/L (ref 3.5–5.1)
Sodium: 140 mEq/L (ref 135–145)

## 2021-10-11 LAB — HEPATIC FUNCTION PANEL
ALT: 27 U/L (ref 0–53)
AST: 20 U/L (ref 0–37)
Albumin: 4.5 g/dL (ref 3.5–5.2)
Alkaline Phosphatase: 62 U/L (ref 39–117)
Bilirubin, Direct: 0.2 mg/dL (ref 0.0–0.3)
Total Bilirubin: 0.8 mg/dL (ref 0.2–1.2)
Total Protein: 6.6 g/dL (ref 6.0–8.3)

## 2021-10-11 LAB — LIPID PANEL
Cholesterol: 124 mg/dL (ref 0–200)
HDL: 46.9 mg/dL (ref >39.00)
LDL Cholesterol: 65 mg/dL (ref 0–99)
NonHDL: 77.2
Total CHOL/HDL Ratio: 3
Triglycerides: 59 mg/dL (ref 0.0–149.0)
VLDL: 11.8 mg/dL (ref 0.0–40.0)

## 2021-10-11 LAB — VITAMIN D 25 HYDROXY (VIT D DEFICIENCY, FRACTURES): VITD: 14.06 ng/mL — ABNORMAL LOW (ref 30.00–100.00)

## 2021-10-11 LAB — TSH: TSH: 2.12 u[IU]/mL (ref 0.35–5.50)

## 2021-10-11 NOTE — Progress Notes (Signed)
? ?  Subjective:  ? ? Patient ID: Jack Peterson, male    DOB: 1980/11/14, 41 y.o.   MRN: [id number] ? ?HPI ?CPE- UTD on Tdap.  Declines flu.  No concerns today ? ?Health Maintenance  ?Topic Date Due  ? HIV Screening  Never done  ? Hepatitis C Screening  Never done  ? COVID-19 Vaccine (2 - Pfizer series) 11/03/2019  ? INFLUENZA VACCINE  10/29/2021 (Originally 03/01/2021)  ? TETANUS/TDAP  08/23/2022  ? HPV VACCINES  Aged Out  ?  ? ? ?Review of Systems ?Patient reports no hearing changes, anorexia, fever ,adenopathy, persistant/recurrent hoarseness, swallowing issues, chest pain, palpitations, edema, persistant/recurrent cough, hemoptysis, dyspnea (rest,exertional, paroxysmal nocturnal), gastrointestinal  bleeding (melena, rectal bleeding), abdominal pain, excessive heart burn, GU symptoms (dysuria, hematuria, voiding/incontinence issues) syncope, focal weakness, memory loss, numbness & tingling, skin/hair/nail changes, depression, anxiety, abnormal bruising/bleeding, musculoskeletal symptoms/signs.  ? ?+ 10 lb weight gain- pt reports trying to eat better and lose weight.  Down 14 lbs over last 3 weeks ?+ decreased vision in L eye ? ?This visit occurred during the SARS-CoV-2 public health emergency.  Safety protocols were in place, including screening questions prior to the visit, additional usage of staff PPE, and extensive cleaning of exam room while observing appropriate contact time as indicated for disinfecting solutions.   ?   ?Objective:  ? Physical Exam ?General Appearance:    Alert, cooperative, no distress, appears stated age  ?Head:    Normocephalic, without obvious abnormality, atraumatic  ?Eyes:    PERRL, conjunctiva/corneas clear, EOM's intact, fundi  ?  benign, both eyes       ?Ears:    Normal TM's and external ear canals, both ears  ?Nose:   Deferred due to COVID  ?Throat:   ?Neck:   Supple, symmetrical, trachea midline, no adenopathy;     ?  thyroid:  No enlargement/tenderness/nodules  ?Back:      Symmetric, no curvature, ROM normal, no CVA tenderness  ?Lungs:     Clear to auscultation bilaterally, respirations unlabored  ?Chest wall:    No tenderness or deformity  ?Heart:    Regular rate and rhythm, S1 and S2 normal, no murmur, rub ?  or gallop  ?Abdomen:     Soft, non-tender, bowel sounds active all four quadrants,  ?  no masses, no organomegaly  ?Genitalia:    deferred  ?Rectal:    ?Extremities:   Extremities normal, atraumatic, no cyanosis or edema  ?Pulses:   2+ and symmetric all extremities  ?Skin:   Skin color, texture, turgor normal, no rashes or lesions  ?Lymph nodes:   Cervical, supraclavicular, and axillary nodes normal  ?Neurologic:   CNII-XII intact. Normal strength, sensation and reflexes    ?  throughout  ?  ? ? ? ?   ?Assessment & Plan:  ? ? ?

## 2021-10-11 NOTE — Assessment & Plan Note (Signed)
Pt is up 10 lbs since his last visit but reports he is actually down 14 lbs in the last 3 weeks.  Applauded his efforts and encouraged him to continue.  Check labs to risk stratify. ?

## 2021-10-11 NOTE — Patient Instructions (Signed)
Follow up in 1 year or as needed ?We'll notify you of your lab results and make any changes if needed ?Keep up the good work on healthy diet and add some regular exercise- you're doing great!!! ?Schedule an eye exam at your convenience ?Call with any questions or concerns ?Stay Safe!  Stay Healthy! ?Happy Spring!!! ?

## 2021-10-11 NOTE — Assessment & Plan Note (Signed)
Pt's PE WNL w/ exception of obesity.  UTD on Tdap.  Check HIV and Hep C to complete health maintenance.  Anticipatory guidance provided.  ?

## 2021-10-12 LAB — HIV ANTIBODY (ROUTINE TESTING W REFLEX): HIV 1&2 Ab, 4th Generation: NONREACTIVE

## 2021-10-12 LAB — HEPATITIS C ANTIBODY
Hepatitis C Ab: NONREACTIVE
SIGNAL TO CUT-OFF: <0.02 (ref <1.00)

## 2021-10-12 MED ORDER — VITAMIN D (ERGOCALCIFEROL) 1.25 MG (50000 UNIT) PO CAPS: 50000.0000 [IU] | ORAL_CAPSULE | ORAL | 0 refills | Status: DC

## 2021-10-12 NOTE — Addendum Note (Signed)
Addended by: Eldred Manges on: 10/12/2021 11:25 AM ? ? Modules accepted: Orders ? ?

## 2021-10-20 ENCOUNTER — Encounter: Payer: Self-pay | Admitting: Registered Nurse

## 2021-10-20 ENCOUNTER — Ambulatory Visit: Payer: BC Managed Care – PPO | Admitting: Registered Nurse

## 2021-10-20 VITALS — BP 126/87 | HR 69 | Temp 98.3°F | Resp 18 | Ht 71.0 in | Wt 318.0 lb

## 2021-10-20 DIAGNOSIS — M25522 Pain in left elbow: Secondary | ICD-10-CM | POA: Diagnosis not present

## 2021-10-20 DIAGNOSIS — N529 Male erectile dysfunction, unspecified: Secondary | ICD-10-CM | POA: Diagnosis not present

## 2021-10-20 MED ORDER — DICLOFENAC SODIUM 75 MG PO TBEC
75.0000 mg | DELAYED_RELEASE_TABLET | Freq: Two times a day (BID) | ORAL | 0 refills | Status: DC
Start: 2021-10-20 — End: 2021-11-24

## 2021-10-20 MED ORDER — SULFAMETHOXAZOLE-TRIMETHOPRIM 800-160 MG PO TABS: 1.0000 | ORAL_TABLET | Freq: Two times a day (BID) | ORAL | 0 refills | Status: DC

## 2021-10-20 MED ORDER — SILDENAFIL CITRATE 20 MG PO TABS: 20.0000 mg | ORAL_TABLET | Freq: Every day | ORAL | 11 refills | Status: DC

## 2021-10-20 NOTE — Patient Instructions (Signed)
Mr. Eilts -  ? ?Great to meet you ? ?Bactrim and diclofenac - take as directed for one week ? ?Sildenafil refilled ? ?If pain ongoing by Monday, call me ? ?Thanks, Go Sox, ? ?Rich  ?

## 2021-10-20 NOTE — Progress Notes (Signed)
? ?Acute Office Visit ? ?Subjective:  ? ? Patient ID: Jack Peterson, male    DOB: 05/31/1981, 41 y.o.   MRN: [id number] ? ?Chief Complaint  ?Patient presents with  ? Pain  ?  Patient states he has been having some pain in left arm after given blood. Patient states its hard to pick up stuff and pain is radiating down whole arm ?  ? ? ?HPI ?Patient is in today for pain in L arm ? ?Was seen here on 10/11/21 and had blood draw ?Since then, L antecubital has been painful. Pain radiating up and down entire arm ?No drainage, redness, swelling, streaking.  ?No distinct joint pain.  ?Has not happened before.  ?Has treated with heat/ice/otc analgesics, minimal relief.  ? ?Notes he needs med refill -  ?Sildenafil ?Using 20-40mg  po qd prn ?Good effect, no AE ? ?Outpatient Medications Prior to Visit  ?Medication Sig Dispense Refill  ? fluticasone (FLONASE) 50 MCG/ACT nasal spray Place 2 sprays into both nostrils daily as needed for allergies or rhinitis.    ? HYDROcodone-acetaminophen (NORCO/VICODIN) 5-325 MG tablet Take 1 tablet by mouth every 6 (six) hours as needed for severe pain. 15 tablet 0  ? ibuprofen (ADVIL) 200 MG tablet Take 600 mg by mouth every 6 (six) hours as needed for mild pain or moderate pain.    ? Vitamin D, Ergocalciferol, (DRISDOL) 1.25 MG (50000 UNIT) CAPS capsule Take 1 capsule (50,000 Units total) by mouth every 7 (seven) days. 12 capsule 0  ? ?No facility-administered medications prior to visit.  ? ? ?Review of Systems ?Per hpi  ? ?   ?Objective:  ?  ?BP 126/87   Pulse 69   Temp 98.3 ?F (36.8 ?C) (Temporal)   Resp 18   Ht 5\' 11"  (1.803 m)   Wt (!) 318 lb (144.2 kg)   SpO2 99%   BMI 44.35 kg/m?  ?Physical Exam ?Constitutional:   ?   General: He is not in acute distress. ?   Appearance: Normal appearance. He is normal weight. He is not ill-appearing, toxic-appearing or diaphoretic.  ?Cardiovascular:  ?   Rate and Rhythm: Normal rate and regular rhythm.  ?   Heart sounds: Normal heart sounds. No  murmur heard. ?  No friction rub. No gallop.  ?Pulmonary:  ?   Effort: Pulmonary effort is normal. No respiratory distress.  ?   Breath sounds: Normal breath sounds. No stridor. No wheezing, rhonchi or rales.  ?Chest:  ?   Chest wall: No tenderness.  ?Musculoskeletal:     ?   General: Tenderness (L dorsal forearm) present. No swelling, deformity or signs of injury. Normal range of motion.  ?   Right lower leg: No edema.  ?   Left lower leg: No edema.  ?Neurological:  ?   General: No focal deficit present.  ?   Mental Status: He is alert and oriented to person, place, and time. Mental status is at baseline.  ?Psychiatric:     ?   Mood and Affect: Mood normal.     ?   Behavior: Behavior normal.     ?   Thought Content: Thought content normal.     ?   Judgment: Judgment normal.  ? ? ?No results found for any visits on 10/20/21. ? ? ?   ?Assessment & Plan:  ?1. Erectile dysfunction, unspecified erectile dysfunction type ?- sildenafil (REVATIO) 20 MG tablet; Take 1-2 tablets (20-40 mg total) by mouth daily.  Dispense: 30  tablet; Refill: 11 ? ?2. Left elbow pain ?- diclofenac (VOLTAREN) 75 MG EC tablet; Take 1 tablet (75 mg total) by mouth 2 (two) times daily.  Dispense: 30 tablet; Refill: 0 ?- sulfamethoxazole-trimethoprim (BACTRIM DS) 800-160 MG tablet; Take 1 tablet by mouth 2 (two) times daily.  Dispense: 14 tablet; Refill: 0 ? ? ? ?Meds ordered this encounter  ?Medications  ? diclofenac (VOLTAREN) 75 MG EC tablet  ?  Sig: Take 1 tablet (75 mg total) by mouth 2 (two) times daily.  ?  Dispense:  30 tablet  ?  Refill:  0  ?  Order Specific Question:   Supervising Provider  ?  Answer:   Neva Seat, JEFFREY R [[id number]]  ? sulfamethoxazole-trimethoprim (BACTRIM DS) 800-160 MG tablet  ?  Sig: Take 1 tablet by mouth 2 (two) times daily.  ?  Dispense:  14 tablet  ?  Refill:  0  ?  Order Specific Question:   Supervising Provider  ?  Answer:   Neva Seat, JEFFREY R [[id number]]  ? sildenafil (REVATIO) 20 MG tablet  ?  Sig: Take 1-2 tablets  (20-40 mg total) by mouth daily.  ?  Dispense:  30 tablet  ?  Refill:  11  ?  Order Specific Question:   Supervising Provider  ?  Answer:   Neva Seat, JEFFREY R [[id number]]  ? ? ?Return if symptoms worsen or fail to improve. ? ?PLAN ?Unclear etiology to arm pain. Will cover with abx and antiinflammatories ?Refill sildenafil ?Patient encouraged to call clinic with any questions, comments, or concerns. ? ?Janeece Agee, NP ?

## 2021-11-03 DIAGNOSIS — M5440 Lumbago with sciatica, unspecified side: Secondary | ICD-10-CM | POA: Diagnosis not present

## 2021-11-04 DIAGNOSIS — M5416 Radiculopathy, lumbar region: Secondary | ICD-10-CM | POA: Diagnosis not present

## 2021-11-04 DIAGNOSIS — M5136 Other intervertebral disc degeneration, lumbar region: Secondary | ICD-10-CM | POA: Diagnosis not present

## 2021-11-08 ENCOUNTER — Other Ambulatory Visit: Payer: Self-pay

## 2021-11-08 ENCOUNTER — Emergency Department (HOSPITAL_BASED_OUTPATIENT_CLINIC_OR_DEPARTMENT_OTHER)
Admission: EM | Admit: 2021-11-08 | Discharge: 2021-11-08 | Disposition: A | Discharge status: Home / Self Care | Payer: BC Managed Care – PPO | Attending: Emergency Medicine | Admitting: Emergency Medicine

## 2021-11-08 ENCOUNTER — Encounter (HOSPITAL_BASED_OUTPATIENT_CLINIC_OR_DEPARTMENT_OTHER): Payer: Self-pay | Admitting: Emergency Medicine

## 2021-11-08 DIAGNOSIS — M5441 Lumbago with sciatica, right side: Secondary | ICD-10-CM | POA: Insufficient documentation

## 2021-11-08 DIAGNOSIS — M5442 Lumbago with sciatica, left side: Secondary | ICD-10-CM | POA: Diagnosis not present

## 2021-11-08 DIAGNOSIS — Z87891 Personal history of nicotine dependence: Secondary | ICD-10-CM | POA: Diagnosis not present

## 2021-11-08 DIAGNOSIS — M545 Low back pain, unspecified: Secondary | ICD-10-CM | POA: Diagnosis not present

## 2021-11-08 HISTORY — DX: Calculus of kidney: N20.0

## 2021-11-08 HISTORY — DX: Other intervertebral disc degeneration, lumbosacral region without mention of lumbar back pain or lower extremity pain: M51.379

## 2021-11-08 HISTORY — DX: Calculus of ureter: N20.1

## 2021-11-08 HISTORY — DX: Other intervertebral disc degeneration, lumbosacral region: M51.37

## 2021-11-08 MED ORDER — KETOROLAC TROMETHAMINE 30 MG/ML IJ SOLN
30.0000 mg | Freq: Once | INTRAMUSCULAR | Status: AC
  Administered 2021-11-08: 30 mg via INTRAMUSCULAR
  Filled 2021-11-08: qty 1

## 2021-11-08 MED ORDER — OXYCODONE-ACETAMINOPHEN 10-325 MG PO TABS: 1.0000 | ORAL_TABLET | Freq: Four times a day (QID) | ORAL | 0 refills | Status: DC | PRN

## 2021-11-08 NOTE — ED Triage Notes (Signed)
Pt states he has degenerative disc disease and last Wednesday he started having pain and it has progressively gotten worse  Pt states it hurts to sit, lay down, or stand  Pt states the pain is radiating down both legs   ?

## 2021-11-08 NOTE — ED Provider Notes (Addendum)
? ?[facility] ?Provider Note: Lowella Dell, MD, FACEP ? ?CSN: [id number] ?MRN: [id number] ?ARRIVAL: 11/08/21 at 0440 ?ROOM: [facility] ? ? ?CHIEF COMPLAINT  ?Back Pain ? ? ?HISTORY OF PRESENT ILLNESS  ?11/08/21 5:00 AM ?Jack Peterson is a 41 y.o. male history of degenerative disc disease in his lumbar spine.  He is here with low back pain that began about 5 days ago.  There was not a specific injury that triggered this, but it is similar to previous episodes.  The pain is located in the mid lower back radiating down the backs of both legs.  It is worse on the right than on the left.  It is worse with movement of his hips.  He denies numbness or weakness.  He denies saddle anesthesia or bowel or bladder changes.  He has been seen at an urgent care and is on steroids and a muscle relaxant but is not getting adequate relief. ? ? ?Past Medical History:  ?Diagnosis Date  ? Degenerative disc disease at L5-S1 level   ? GERD (gastroesophageal reflux disease)   ? Nephrolithiasis   ? Ureterolithiasis   ? ? ?Past Surgical History:  ?Procedure Laterality Date  ? CHOLECYSTECTOMY N/A 03/03/2021  ? Procedure: LAPAROSCOPIC CHOLECYSTECTOMY;  Surgeon: Fritzi Mandes, MD;  [facility];  Service: General;  Laterality: N/A;  ? ESOPHAGEAL DILATION    ? LITHOTRIPSY Left 2016  ? TONSILLECTOMY    ? ? ?Family History  ?Problem Relation Age of Onset  ? Hypertension Father   ? Cancer Father   ?     prostate  ? Hypertension Maternal Grandmother   ? Cancer Paternal Grandfather   ?     prostate  ? Cancer Paternal Great-grandfather   ?     prostate  ? Diabetes Neg Hx   ? Stroke Neg Hx   ? ? ?Social History  ? ?Tobacco Use  ? Smoking status: Former  ?  Types: Cigarettes  ?  Quit date: 2021  ?  Years since quitting: 2.2  ? Smokeless tobacco: Never  ?Vaping Use  ? Vaping Use: Never used  ?Substance Use Topics  ? Alcohol use: Yes  ?  Alcohol/week: 2.0 - 3.0 standard drinks  ?  Types: 2 - 3 Standard drinks or equivalent per week  ?   Comment: social  ? Drug use: No  ? ? ?Prior to Admission medications   ?Medication Sig Start Date End Date Taking? Authorizing Provider  ?oxyCODONE-acetaminophen (PERCOCET) 10-325 MG tablet Take 1 tablet by mouth every 6 (six) hours as needed for pain. 11/08/21  Yes Lakesa Coste, MD  ?diclofenac (VOLTAREN) 75 MG EC tablet Take 1 tablet (75 mg total) by mouth 2 (two) times daily. 10/20/21   Janeece Agee, NP  ?fluticasone (FLONASE) 50 MCG/ACT nasal spray Place 2 sprays into both nostrils daily as needed for allergies or rhinitis.    [provider]  ?sildenafil (REVATIO) 20 MG tablet Take 1-2 tablets (20-40 mg total) by mouth daily. 10/20/21   Janeece Agee, NP  ?sulfamethoxazole-trimethoprim (BACTRIM DS) 800-160 MG tablet Take 1 tablet by mouth 2 (two) times daily. 10/20/21   Janeece Agee, NP  ?Vitamin D, Ergocalciferol, (DRISDOL) 1.25 MG (50000 UNIT) CAPS capsule Take 1 capsule (50,000 Units total) by mouth every 7 (seven) days. 10/12/21   Sheliah Hatch, MD  ? ? ?Allergies ?Tuna oil [fish oil] ? ? ?REVIEW OF SYSTEMS  ?Negative except as noted here or in the History of Present Illness. ? ? ?  PHYSICAL EXAMINATION  ?Initial Vital Signs ?Blood pressure (!) 149/95, pulse 71, temperature 97.9 ?F (36.6 ?C), temperature source Oral, resp. rate 18, height 5\' 11"  (1.803 m), weight (!) 142.9 kg, SpO2 97 %. ? ?Examination ?General: Well-developed, high BMI male in no acute distress; appearance consistent with age of record ?HENT: normocephalic; atraumatic ?Eyes: Normal appearance ?Neck: supple ?Heart: regular rate and rhythm ?Lungs: clear to auscultation bilaterally ?Abdomen: soft; nondistended; nontender; bowel sounds present ?Back: Positive straight leg raise on the left at 15 degrees, on the right at 5 degrees ?Extremities: No deformity; pulses normal ?Neurologic: Awake, alert and oriented; motor function intact in all extremities and symmetric but examination in lower extremities limited due to pain on hip  movement; sensation intact and symmetric in lower extremities; no saddle anesthesia; no facial droop ?Skin: Warm and dry ?Psychiatric: Normal mood and affect ? ? ?RESULTS  ?Summary of this visit's results, reviewed and interpreted by myself: ? ? EKG Interpretation ? ?Date/Time:    ?Ventricular Rate:    ?PR Interval:    ?QRS Duration:   ?QT Interval:    ?QTC Calculation:   ?R Axis:     ?Text Interpretation:   ?  ? ?  ? ?Laboratory Studies: ?No results found for this or any previous visit (from the past 24 hour(s)). ?Imaging Studies: ?No results found. ? ?ED COURSE and MDM  ?Nursing notes, initial and subsequent vitals signs, including pulse oximetry, reviewed and interpreted by myself. ? ?Vitals:  ? 11/08/21 0452 11/08/21 0456  ?BP:  (!) 149/95  ?Pulse:  71  ?Resp:  18  ?Temp:  97.9 ?F (36.6 ?C)  ?TempSrc:  Oral  ?SpO2:  97%  ?Weight: (!) 142.9 kg   ?Height: 5\' 11"  (1.803 m)   ? ?Medications  ?ketorolac (TORADOL) 30 MG/ML injection 30 mg (30 mg Intramuscular Given 11/08/21 0502)  ? ?The patient has an appointment with his neurosurgeon, Dr. , on 11/18/2021.  We will treat with a short course of narcotic analgesia in the meantime.  As noted above he is already on steroids and a muscle relaxant.  There are no signs concerning for cauda equina syndrome or other conditions requiring emergent MRI at this time. ? ? ?PROCEDURES  ?Procedures ? ? ?ED DIAGNOSES  ? ?  ICD-10-CM   ?1. Acute midline low back pain with bilateral sciatica  M54.42   ? M54.41   ?  ? ? ? ?  ?Deandrae Wajda, MD ?11/08/21 0505 ? ?  ?11/20/2021, MD ?11/08/21 [phone] ? ?

## 2021-11-08 NOTE — ED Notes (Signed)
Rx x 1 given  Written and verbal inst to pt  Verbalized an understanding  To home  

## 2021-11-18 DIAGNOSIS — M5416 Radiculopathy, lumbar region: Secondary | ICD-10-CM | POA: Diagnosis not present

## 2021-11-24 ENCOUNTER — Emergency Department (HOSPITAL_COMMUNITY): Payer: BC Managed Care – PPO

## 2021-11-24 ENCOUNTER — Encounter (HOSPITAL_COMMUNITY): Payer: Self-pay

## 2021-11-24 ENCOUNTER — Emergency Department (HOSPITAL_COMMUNITY)
Admission: EM | Admit: 2021-11-24 | Discharge: 2021-11-24 | Disposition: A | Discharge status: Home / Self Care | Payer: BC Managed Care – PPO | Attending: Emergency Medicine | Admitting: Emergency Medicine

## 2021-11-24 ENCOUNTER — Other Ambulatory Visit: Payer: Self-pay

## 2021-11-24 DIAGNOSIS — M545 Low back pain, unspecified: Secondary | ICD-10-CM | POA: Insufficient documentation

## 2021-11-24 DIAGNOSIS — M546 Pain in thoracic spine: Secondary | ICD-10-CM | POA: Diagnosis not present

## 2021-11-24 DIAGNOSIS — M5020 Other cervical disc displacement, unspecified cervical region: Secondary | ICD-10-CM | POA: Diagnosis not present

## 2021-11-24 DIAGNOSIS — M5021 Other cervical disc displacement,  high cervical region: Secondary | ICD-10-CM | POA: Diagnosis not present

## 2021-11-24 DIAGNOSIS — R209 Unspecified disturbances of skin sensation: Secondary | ICD-10-CM | POA: Diagnosis not present

## 2021-11-24 DIAGNOSIS — M542 Cervicalgia: Secondary | ICD-10-CM | POA: Diagnosis not present

## 2021-11-24 DIAGNOSIS — R200 Anesthesia of skin: Secondary | ICD-10-CM | POA: Diagnosis not present

## 2021-11-24 MED ORDER — NAPROXEN 375 MG PO TABS: 375.0000 mg | ORAL_TABLET | Freq: Two times a day (BID) | ORAL | 0 refills | Status: DC

## 2021-11-24 MED ORDER — DEXAMETHASONE SODIUM PHOSPHATE 10 MG/ML IJ SOLN
10.0000 mg | Freq: Once | INTRAMUSCULAR | Status: AC
  Administered 2021-11-24: 10 mg via INTRAMUSCULAR
  Filled 2021-11-24: qty 1

## 2021-11-24 MED ORDER — HYDROMORPHONE HCL 1 MG/ML IJ SOLN
1.0000 mg | Freq: Once | INTRAMUSCULAR | Status: AC
  Administered 2021-11-24: 1 mg via INTRAMUSCULAR
  Filled 2021-11-24: qty 1

## 2021-11-24 MED ORDER — OXYCODONE-ACETAMINOPHEN 5-325 MG PO TABS: 1.0000 | ORAL_TABLET | Freq: Four times a day (QID) | ORAL | 0 refills | Status: DC | PRN

## 2021-11-24 MED ORDER — PREDNISONE 10 MG PO TABS: ORAL_TABLET | ORAL | 0 refills | Status: AC

## 2021-11-24 MED ORDER — GADOBUTROL 1 MMOL/ML IV SOLN
10.0000 mL | Freq: Once | INTRAVENOUS | Status: AC | PRN
  Administered 2021-11-24: 10 mL via INTRAVENOUS

## 2021-11-24 NOTE — ED Provider Triage Note (Signed)
Emergency Medicine Provider Triage Evaluation Note ? ?Jack Peterson , a 41 y.o. male  was evaluated in triage.  Pt complains of back pain.  Patient has history of degenerative disc disease with lumbar back pain radiating to right lower leg.  Pain started yesterday.  Pain is in his lumbar back and radiating to his cervical neck.  Patient reports that he is having numbness to bilateral upper extremities.  Denies any recent falls or injuries. ? ?Denies any fever, chills, saddle anesthesia, bowel/bladder dysfunction, headache, visual disturbance, lightheadedness, syncope, IV drug use, history of malignancy. ? ?Review of Systems  ?Positive: Back pain ?Negative: See above ? ?Physical Exam  ?BP (!) 152/94 (BP Location: Right Arm)   Pulse 64   Temp 98.4 ?F (36.9 ?C) (Oral)   Resp 16   Ht 5\' 11"  (1.803 m)   Wt (!) 142.9 kg   SpO2 94%   BMI 43.93 kg/m?  ?Gen:   Awake, no distress   ?Resp:  Normal effort  ?MSK:   Moves extremities without difficulty  ?Other:  No midline tenderness or deformity to cervical, thoracic, or lumbar spine.  Patient has diffuse tenderness to right lumbar back. ? ?Negative pronator drift.  Sensation grossly intact to bilateral upper and lower extremities. ? ?Medical Decision Making  ?Medically screening exam initiated at 12:59 PM.  Appropriate orders placed.  Daquila was informed that the remainder of the evaluation will be completed by another provider, this initial triage assessment does not replace that evaluation, and the importance of remaining in the ED until their evaluation is complete. ? ? ?  ?Marletta Lor, PA-C ?11/24/21 1301 ? ?

## 2021-11-24 NOTE — ED Triage Notes (Signed)
Reports lower back pain that is creeping up his back.  Reports arms feel numb.  Hx DDD Denies urinary symptoms denies CP SOB ?

## 2021-11-24 NOTE — ED Provider Notes (Signed)
?[facility] ?Provider Note ? ? ?CSN: [id number] ?Arrival date & time: 11/24/21  1222 ? ?  ? ?History ? ?Chief Complaint  ?Patient presents with  ? Back Pain  ? ? ?Jack Peterson is a 41 y.o. male. ? ? ?Back Pain ? ?Patient presents to the ED for evaluation of back and neck pain.  Patient states he has history of lower back pain.  He has had some radicular pain going down into his legs.  Patient has been seeing his spine doctor Dr. Yetta BarreJones.  They may end up getting an MRI.  However patient states in the last day or so he started having pain in his neck and is having numbness in his arms.  He has tingling in his upper extremities he feels like his grips strength is weak.  No recent trauma.  No fevers or chills. ? ?Home Medications ?Prior to Admission medications   ?Medication Sig Start Date End Date Taking? Authorizing Provider  ?acetaminophen (TYLENOL) 500 MG tablet Take 1,000 mg by mouth every 6 (six) hours as needed for moderate pain.   Yes [provider]  ?fluticasone (FLONASE) 50 MCG/ACT nasal spray Place 2 sprays into both nostrils daily as needed for allergies or rhinitis.   Yes [provider]  ?naproxen (NAPROSYN) 375 MG tablet Take 1 tablet (375 mg total) by mouth 2 (two) times daily. 11/24/21  Yes Linwood DibblesKnapp, Zuleika Gallus, MD  ?oxyCODONE-acetaminophen (PERCOCET/ROXICET) 5-325 MG tablet Take 1 tablet by mouth every 6 (six) hours as needed for severe pain. 11/24/21  Yes Linwood DibblesKnapp, Calianne Larue, MD  ?predniSONE (DELTASONE) 10 MG tablet Take 6 tablets (60 mg total) by mouth daily with breakfast for 2 days, THEN 5 tablets (50 mg total) daily with breakfast for 2 days, THEN 4 tablets (40 mg total) daily with breakfast for 2 days, THEN 3 tablets (30 mg total) daily with breakfast for 2 days, THEN 2 tablets (20 mg total) daily with breakfast for 2 days, THEN 1 tablet (10 mg total) daily with breakfast for 2 days. 11/24/21 12/06/21 Yes Linwood DibblesKnapp, Dewarren Ledbetter, MD  ?sildenafil (REVATIO) 20 MG tablet Take  1-2 tablets (20-40 mg total) by mouth daily. ?Patient not taking: Reported on 11/24/2021 10/20/21   Janeece AgeeMorrow, Richard, NP  ?sulfamethoxazole-trimethoprim (BACTRIM DS) 800-160 MG tablet Take 1 tablet by mouth 2 (two) times daily. ?Patient not taking: Reported on 11/24/2021 10/20/21   Janeece AgeeMorrow, Richard, NP  ?Vitamin D, Ergocalciferol, (DRISDOL) 1.25 MG (50000 UNIT) CAPS capsule Take 1 capsule (50,000 Units total) by mouth every 7 (seven) days. ?Patient not taking: Reported on 11/24/2021 10/12/21   Sheliah Hatchabori, Katherine E, MD  ?   ? ?Allergies    ?Tuna oil [fish oil]   ? ?Review of Systems   ?Review of Systems  ?Musculoskeletal:  Positive for back pain.  ? ?Physical Exam ?Updated Vital Signs ?BP (!) 158/75   Pulse 61   Temp 98.4 ?F (36.9 ?C) (Oral)   Resp 16   Ht 1.803 m (5\' 11" )   Wt (!) 142.9 kg   SpO2 97%   BMI 43.93 kg/m?  ?Physical Exam ?Vitals and nursing note reviewed.  ?Constitutional:   ?   General: He is not in acute distress. ?   Appearance: He is well-developed.  ?HENT:  ?   Head: Normocephalic and atraumatic.  ?   Right Ear: External ear normal.  ?   Left Ear: External ear normal.  ?Eyes:  ?   General: No scleral icterus.    ?  Right eye: No discharge.     ?   Left eye: No discharge.  ?   Conjunctiva/sclera: Conjunctivae normal.  ?Neck:  ?   Trachea: No tracheal deviation.  ?Cardiovascular:  ?   Rate and Rhythm: Normal rate.  ?Pulmonary:  ?   Effort: Pulmonary effort is normal. No respiratory distress.  ?   Breath sounds: No stridor.  ?Abdominal:  ?   General: There is no distension.  ?Musculoskeletal:     ?   General: No swelling or deformity.  ?   Cervical back: Neck supple.  ?   Comments: Tenderness palpation paraspinal muscles cervical region  ?Skin: ?   General: Skin is warm and dry.  ?   Findings: No rash.  ?Neurological:  ?   Mental Status: He is alert.  ?   Cranial Nerves: Cranial nerve deficit: no gross deficits.  ?   Comments: Patient with bilateral 4 5 grip strength, sensation is intact to light touch  in the upper extremities, patient is able to lift legs off the bed, no 5 out of 5 plantar dorsiflexion strength, sensation intact lower extremities  ? ? ?ED Results / Procedures / Treatments   ?Labs ?(all labs ordered are listed, but only abnormal results are displayed) ?Labs Reviewed - No data to display ? ?EKG ?None ? ?Radiology ?DG Cervical Spine Complete ? ?Result Date: 11/24/2021 ?CLINICAL DATA:  Lower back pain for 2 weeks, mid back pain in arms since yesterday, history of degenerative disc disease EXAM: LUMBAR SPINE - COMPLETE 4+ VIEW; THORACIC SPINE 2 VIEWS; CERVICAL SPINE - COMPLETE 4+ VIEW COMPARISON:  CT abdomen/pelvis 10/28/2020, lumbar spine MRI 10/06/2018 FINDINGS: Cervical: The cervical spine is imaged through the C7 vertebral body in the lateral projection. There is straightening of the normal cervical lordosis. There is no antero or retrolisthesis. Vertebral body heights are preserved, without evidence of acute injury. The disc heights are overall preserved, with minimal degenerative endplate change at C4-C5 through C6-C7. The neural foramina appear patent. The prevertebral soft tissues are unremarkable. Thoracic: The upper thoracic vertebral bodies are suboptimally assessed due to overlying structures. The imaged thoracic vertebral body heights are preserved, without evidence of acute injury. There is minimal dextroscoliosis centered at T7. Sagittal alignment is normal, with no antero or retrolisthesis. The disc heights are preserved. There is no significant degenerative change. The imaged heart and lungs unremarkable. Lumbar: There are 5 non-rib-bearing lumbar type vertebral bodies. Vertebral body heights are preserved, without evidence of acute injury. Alignment is normal. There is no evidence of spondylolysis. There is mild disc space narrowing at L4-L5 and L5-S1 with associated degenerative endplate change. There is mild facet arthropathy at L5-S1. The SI joints are intact.  Cholecystectomy  clips are noted. IMPRESSION: 1. No acute finding in the cervical, thoracic, or lumbar spine. 2. Minimal degenerative endplate change in the cervical spine. The neural foramina appear patent. 3. Mild dextrocurvature of the thoracic spine centered at T7. Otherwise unremarkable thoracic spine radiographs. 4. Mild degenerative changes at L4-L5 and L5-S1. Electronically Signed   By: Lesia Hausen M.D.   On: 11/24/2021 14:09  ? ?DG Thoracic Spine 2 View ? ?Result Date: 11/24/2021 ?CLINICAL DATA:  Lower back pain for 2 weeks, mid back pain in arms since yesterday, history of degenerative disc disease EXAM: LUMBAR SPINE - COMPLETE 4+ VIEW; THORACIC SPINE 2 VIEWS; CERVICAL SPINE - COMPLETE 4+ VIEW COMPARISON:  CT abdomen/pelvis 10/28/2020, lumbar spine MRI 10/06/2018 FINDINGS: Cervical: The cervical spine is imaged through the  C7 vertebral body in the lateral projection. There is straightening of the normal cervical lordosis. There is no antero or retrolisthesis. Vertebral body heights are preserved, without evidence of acute injury. The disc heights are overall preserved, with minimal degenerative endplate change at C4-C5 through C6-C7. The neural foramina appear patent. The prevertebral soft tissues are unremarkable. Thoracic: The upper thoracic vertebral bodies are suboptimally assessed due to overlying structures. The imaged thoracic vertebral body heights are preserved, without evidence of acute injury. There is minimal dextroscoliosis centered at T7. Sagittal alignment is normal, with no antero or retrolisthesis. The disc heights are preserved. There is no significant degenerative change. The imaged heart and lungs unremarkable. Lumbar: There are 5 non-rib-bearing lumbar type vertebral bodies. Vertebral body heights are preserved, without evidence of acute injury. Alignment is normal. There is no evidence of spondylolysis. There is mild disc space narrowing at L4-L5 and L5-S1 with associated degenerative endplate change.  There is mild facet arthropathy at L5-S1. The SI joints are intact.  Cholecystectomy clips are noted. IMPRESSION: 1. No acute finding in the cervical, thoracic, or lumbar spine. 2. Minimal degenerative endpla

## 2021-11-24 NOTE — ED Notes (Signed)
To MRI

## 2021-11-30 DIAGNOSIS — M5412 Radiculopathy, cervical region: Secondary | ICD-10-CM | POA: Diagnosis not present

## 2021-12-01 ENCOUNTER — Other Ambulatory Visit: Payer: Self-pay | Admitting: Student

## 2021-12-01 DIAGNOSIS — M5412 Radiculopathy, cervical region: Secondary | ICD-10-CM

## 2021-12-06 NOTE — Therapy (Signed)
?OUTPATIENT PHYSICAL THERAPY CERVICAL EVALUATION ? ? ?Patient Name: Jack Peterson ?MRN: [id number] ?DOB:Dec 20, 1980, 41 y.o., male ?Today's Date: 12/08/2021 ? ? PT End of Session - 12/08/21 0849   ? ? Visit Number 1   ? Date for PT Re-Evaluation 02/02/22   ? Authorization Type [employer] - VL:30 ($50 copay)   ? PT Start Time [phone]   ? PT Stop Time 0940   ? PT Time Calculation (min) 51 min   ? Activity Tolerance Patient tolerated treatment well;Patient limited by pain   ? Behavior During Therapy [facility] for tasks assessed/performed   ? ?  ?  ? ?  ? ? ?Past Medical History:  ?Diagnosis Date  ? Degenerative disc disease at L5-S1 level   ? GERD (gastroesophageal reflux disease)   ? Nephrolithiasis   ? Ureterolithiasis   ? ?Past Surgical History:  ?Procedure Laterality Date  ? CHOLECYSTECTOMY N/A 03/03/2021  ? Procedure: LAPAROSCOPIC CHOLECYSTECTOMY;  Surgeon: Fritzi Mandes, MD;  Location: [facility] OR;  Service: General;  Laterality: N/A;  ? ESOPHAGEAL DILATION    ? LITHOTRIPSY Left 2016  ? TONSILLECTOMY    ? ?Patient Active Problem List  ? Diagnosis Date Noted  ? S/P laparoscopic cholecystectomy 03/23/2021  ? Morbid obesity (HCC) 02/20/2020  ? Carpal tunnel syndrome 10/08/2018  ? Neck pain 10/08/2018  ? Numbness of hand 09/25/2018  ? BRBPR (bright red blood per rectum) 09/12/2014  ? Routine general medical examination at a health care facility 08/23/2012  ? GERD 03/26/2010  ? BACK PAIN 03/26/2010  ? NEPHROLITHIASIS, HX OF 03/26/2010  ? ? ?PCP: Sheliah Hatch, MD ? ?REFERRING PROVIDER: Sherryl Manges, NP ? ?REFERRING DIAG: M54.12 (ICD-10-CM) - Radiculopathy, cervical region ? ?THERAPY DIAG:  ?Radiculopathy, cervical region ? ?Cervicalgia ? ?Abnormal posture ? ?Muscle weakness (generalized) ? ?Other muscle spasm ? ?ONSET DATE: ~11/24/21 (ED visit) ? ?SUBJECTIVE:                                                                                                                                                                                                         ? ?SUBJECTIVE STATEMENT: ?Pt reports he has a herniated disc in his neck causing pain along with radicular pain, numbness and tingling into B arms. Sharp pain noted with leaning fwd, coughing, laughing or sneezing. Limited ROM causes him to have to turn his whole body. Pain prevents him from sleeping in bed, usually sleeping in a recliner. Scheduled for epidural injection tomorrow. ? ?PERTINENT HISTORY:  ?Carpal tunnel syndrome, morbid obesity, back pain, GERD ? ?PAIN:  ?Are  you having pain? Yes: NPRS scale: 7/10 ?Pain location: L>R lower neck ?Pain description: "feels like I've chopped wood for about 8 hrs" ?Aggravating factors: Leaning forward; coughing, laughing or sneezing ?Relieving factors: sitting in a chair with a firm back ? ?PRECAUTIONS: None ? ?WEIGHT BEARING RESTRICTIONS No ? ?FALLS:  ?Has patient fallen in last 6 months? No ? ?LIVING ENVIRONMENT: ?Lives with: lives with their spouse ?Lives in: House/apartment ?Stairs: Yes: Internal: 14 steps; on right going up and on left going up and External: 3 steps; none ?Has following equipment at home: None ? ?OCCUPATION: FT Engineer, manufacturing- Chemical operator in manufacturing  ? ?PLOF: Independent, Vocation/Vocational requirements: lifting 50+ lbs daily up to a couple hrs per day, a lot of bending and twisting, and Leisure: golf, cover band, walking the dogs ? ?PATIENT GOALS: "To try to alleviate the pain as much as possible so I can function normally." ? ?OBJECTIVE:  ? ?DIAGNOSTIC FINDINGS:  ?11/24/21 - Cervical MRI: Extruded disc fragment on the left at C5-6 with cord flattening and probable left C6 nerve root impingement. Mild to moderate spinal stenosis. ? ?PATIENT SURVEYS:  ?FOTO = 35 ? ? ?COGNITION: ?Overall cognitive status: Within functional limits for tasks assessed ? ? ?SENSATION: ?Fairly consistent B UE numbness and tingling from cervical radiculopathy but otherwise WFL ? ?POSTURE:  ?Fwd head and flexed neck with rounded  shoulders ? ?PALPATION: ?Increased muscle tension and TTP in B cervical paraspinals, UT, LS and medial scapular muscles  ? ?CERVICAL ROM:  ? ?Active ROM A/PROM (deg) ?12/08/2021  ?Flexion 14  ?Extension 10  ?Right lateral flexion 15  ?Left lateral flexion 9  ?Right rotation 25  ?Left rotation 25  ? (Blank rows = not tested) ? ?Patient very guarded with attempts at cervical PROM. ? ?UE ROM: ? ?Active ROM Right ?12/08/2021 Left ?12/08/2021  ?Shoulder flexion 102 101  ?Shoulder extension    ?Shoulder abduction 101 99  ?Shoulder adduction    ?Shoulder extension    ?Shoulder internal rotation FER to occiput FER to occiput  ?Shoulder external rotation FIR to L4 FIR to L4  ?Elbow flexion    ?Elbow extension    ?Wrist flexion    ?Wrist extension    ?Wrist ulnar deviation    ?Wrist radial deviation    ?Wrist pronation    ?Wrist supination    ? (Blank rows = not tested) ? ?UE MMT: ? ?MMT Right ?12/08/2021 Left ?12/08/2021  ?Shoulder flexion 4 * 4 *  ?Shoulder extension    ?Shoulder abduction 4 * 4 *  ?Shoulder adduction    ?Shoulder extension    ?Shoulder internal rotation 4 4  ?Shoulder external rotation 3+ 4-  ?Middle trapezius    ?Lower trapezius    ?Elbow flexion    ?Elbow extension    ?Wrist flexion    ?Wrist extension    ?Wrist ulnar deviation    ?Wrist radial deviation    ?Wrist pronation    ?Wrist supination    ?Grip strength 9.67 lbs 14.67 lbs  ? (Blank rows = not tested) ?*Increased pain with resistance ? ?CERVICAL SPECIAL TESTS:  ?Spurling's test: Positive and Distraction test: Positive ? ? ?TODAY'S TREATMENT:  ?THERAPEUTIC EXERCISES: Instruction in HEP: ?Standing Cervical Retraction  - 3 x daily - 7 x weekly - 2 sets - 10 reps - 5 sec hold ?Seated Scapular Retraction with External Rotation  - 3 x daily - 7 x weekly - 2 sets - 10 reps - 3-5 sec hold ?Seated Shoulder Shrug Circles  AROM Backward  - 3 x daily - 7 x weekly - 2 sets - 10 reps ?(Cervical and scapular retraction also attempted in hooklying but better tolerated  in sitting) ? ?PATIENT EDUCATION:  ?Education details: PT eval findings, anticipated POC and initial HEP ?Person educated: Patient ?Education method: Explanation, Demonstration, Verbal cues, and MedBridge code texted to pt ?Education comprehension: verbalized understanding, returned demonstration, verbal cues required, and needs further education ? ? ?HOME EXERCISE PROGRAM: ?Access Code: [email or web address] (texted to patient) ? ?ASSESSMENT: ? ?CLINICAL IMPRESSION: ?Abdallah is a 41 y.o. male who was seen today for physical therapy evaluation and treatment for acute neck pain with cervical radiculopathy secondary to herniated disc. He reports insidious onset of lower neck and B UE radiculopathy ~2 weeks following acute exacerbation of his LBP. Pain severely limits motion of his neck in all directions as well as limits functional use of B UE. Deficits include neck and upper shoulder pain, B UE radicular pain, numbness and tingling, postural abnormality, severely limited cervical and B shoulder ROM, decreased flexibility, increased muscle tension and spasms, and decreased B UE and grip weakness. Pain, LOM and weakness interfere with all aspects of daily life including sleeping, positional tolerance, ADLs, driving and work (his job does not offer formal light duty but has been working with him to limit need for much lifting). Rino will benefit from skilled PT to address above deficits to improve posture, restore pain free functional ROM and strength to improve tolerance for his normal daily activities.  ? ?OBJECTIVE IMPAIRMENTS decreased activity tolerance, decreased knowledge of condition, decreased ROM, decreased strength, increased fascial restrictions, impaired perceived functional ability, increased muscle spasms, impaired flexibility, impaired sensation, impaired UE functional use, improper body mechanics, postural dysfunction, and pain.  ? ?ACTIVITY LIMITATIONS community activity, driving, occupation, and yard work.   ? ?PERSONAL FACTORS Fitness, Past/current experiences, Profession, Time since onset of injury/illness/exacerbation, and 3+ comorbidities: Carpal tunnel syndrome, morbid obesity, back pain, GERD are also affecting pati

## 2021-12-08 ENCOUNTER — Ambulatory Visit: Payer: BC Managed Care – PPO | Attending: Student | Admitting: Physical Therapy

## 2021-12-08 ENCOUNTER — Other Ambulatory Visit: Payer: Self-pay

## 2021-12-08 ENCOUNTER — Encounter: Payer: Self-pay | Admitting: Physical Therapy

## 2021-12-08 DIAGNOSIS — M6281 Muscle weakness (generalized): Secondary | ICD-10-CM | POA: Insufficient documentation

## 2021-12-08 DIAGNOSIS — M542 Cervicalgia: Secondary | ICD-10-CM | POA: Diagnosis not present

## 2021-12-08 DIAGNOSIS — R293 Abnormal posture: Secondary | ICD-10-CM | POA: Diagnosis not present

## 2021-12-08 DIAGNOSIS — M62838 Other muscle spasm: Secondary | ICD-10-CM | POA: Insufficient documentation

## 2021-12-08 DIAGNOSIS — M5412 Radiculopathy, cervical region: Secondary | ICD-10-CM | POA: Diagnosis not present

## 2021-12-09 ENCOUNTER — Ambulatory Visit
Admission: RE | Admit: 2021-12-09 | Discharge: 2021-12-09 | Disposition: A | Discharge status: Home / Self Care | Payer: BC Managed Care – PPO | Source: Ambulatory Visit | Attending: Student | Admitting: Student

## 2021-12-09 DIAGNOSIS — M5412 Radiculopathy, cervical region: Secondary | ICD-10-CM

## 2021-12-09 DIAGNOSIS — M50122 Cervical disc disorder at C5-C6 level with radiculopathy: Secondary | ICD-10-CM | POA: Diagnosis not present

## 2021-12-09 MED ORDER — IOPAMIDOL (ISOVUE-M 300) INJECTION 61%
1.0000 mL | Freq: Once | INTRAMUSCULAR | Status: AC | PRN
  Administered 2021-12-09: 1 mL via EPIDURAL

## 2021-12-09 MED ORDER — TRIAMCINOLONE ACETONIDE 40 MG/ML IJ SUSP (RADIOLOGY)
60.0000 mg | Freq: Once | INTRAMUSCULAR | Status: AC
  Administered 2021-12-09: 60 mg via EPIDURAL

## 2021-12-09 NOTE — Discharge Instructions (Signed)

## 2021-12-13 NOTE — Therapy (Incomplete)
?OUTPATIENT PHYSICAL THERAPY TREATMENT ? ? ?Patient Name: Jack Peterson ?MRN: [id number] ?DOB:Mar 06, 1981, 41 y.o., male ?Today's Date: 12/13/2021 ? ? ? ? ?Past Medical History:  ?Diagnosis Date  ? Degenerative disc disease at L5-S1 level   ? GERD (gastroesophageal reflux disease)   ? Nephrolithiasis   ? Ureterolithiasis   ? ?Past Surgical History:  ?Procedure Laterality Date  ? CHOLECYSTECTOMY N/A 03/03/2021  ? Procedure: LAPAROSCOPIC CHOLECYSTECTOMY;  Surgeon: Fritzi MandesAllen, Shelby L, MD;  [facility];  Service: General;  Laterality: N/A;  ? ESOPHAGEAL DILATION    ? LITHOTRIPSY Left 2016  ? TONSILLECTOMY    ? ?Patient Active Problem List  ? Diagnosis Date Noted  ? S/P laparoscopic cholecystectomy 03/23/2021  ? Morbid obesity (HCC) 02/20/2020  ? Carpal tunnel syndrome 10/08/2018  ? Neck pain 10/08/2018  ? Numbness of hand 09/25/2018  ? BRBPR (bright red blood per rectum) 09/12/2014  ? Routine general medical examination at a health care facility 08/23/2012  ? GERD 03/26/2010  ? BACK PAIN 03/26/2010  ? NEPHROLITHIASIS, HX OF 03/26/2010  ? ? ?PCP: Sheliah HatchKatherine E Tabori, MD ? ?REFERRING PROVIDER: Sherryl MangesKimberly Hannah Meyran, NP ? ?REFERRING DIAG: M54.12 (ICD-10-CM) - Radiculopathy, cervical region ? ?THERAPY DIAG:  ?No diagnosis found. ? ?ONSET DATE: ~11/24/21 (ED visit) ? ?SUBJECTIVE:                                                                                                                                                                                                        ? ?SUBJECTIVE STATEMENT: ?*** ? ?Pt reports he has a herniated disc in his neck causing pain along with radicular pain, numbness and tingling into B arms. Sharp pain noted with leaning fwd, coughing, laughing or sneezing. Limited ROM causes him to have to turn his whole body. Pain prevents him from sleeping in bed, usually sleeping in a recliner. Scheduled for epidural injection tomorrow. ? ?PERTINENT HISTORY:  ?Carpal tunnel syndrome, morbid obesity,  back pain, GERD ? ?PAIN:  ?Are you having pain? Yes: {yespain:[id number]::"NPRS scale: ***/10","Pain location: ***","Pain description: ***","Aggravating factors: ***","Relieving factors: ***"} ? ?PAIN:  ?Are you having pain? Yes: NPRS scale: 7/10 ?Pain location: L>R lower neck ?Pain description: "feels like I've chopped wood for about 8 hrs" ?Aggravating factors: Leaning forward; coughing, laughing or sneezing ?Relieving factors: sitting in a chair with a firm back ? ?PRECAUTIONS: None ? ?WEIGHT BEARING RESTRICTIONS No ? ?FALLS:  ?Has patient fallen in last 6 months? No ? ?LIVING ENVIRONMENT: ?Lives with: lives with their spouse ?Lives in: House/apartment ?Stairs: Yes: Internal: 14 steps; on right going  up and on left going up and External: 3 steps; none ?Has following equipment at home: None ? ?OCCUPATION: FT Engineer, manufacturing in manufacturing  ? ?PLOF: Independent, Vocation/Vocational requirements: lifting 50+ lbs daily up to a couple hrs per day, a lot of bending and twisting, and Leisure: golf, cover band, walking the dogs ? ?PATIENT GOALS: "To try to alleviate the pain as much as possible so I can function normally." ? ? ?OBJECTIVE: (objective measures completed at initial evaluation unless otherwise dated) ? ?DIAGNOSTIC FINDINGS:  ?11/24/21 - Cervical MRI: Extruded disc fragment on the left at C5-6 with cord flattening and probable left C6 nerve root impingement. Mild to moderate spinal stenosis. ? ?PATIENT SURVEYS:  ?FOTO = 35 ? ?COGNITION: ?Overall cognitive status: Within functional limits for tasks assessed ? ?SENSATION: ?Fairly consistent B UE numbness and tingling from cervical radiculopathy but otherwise WFL ? ?POSTURE:  ?Fwd head and flexed neck with rounded shoulders ? ?PALPATION: ?Increased muscle tension and TTP in B cervical paraspinals, UT, LS and medial scapular muscles  ? ?CERVICAL ROM:  ? ?Active ROM A/PROM (deg) ?12/08/2021  ?Flexion 14  ?Extension 10  ?Right lateral flexion 15  ?Left lateral  flexion 9  ?Right rotation 25  ?Left rotation 25  ? (Blank rows = not tested) ? ?Patient very guarded with attempts at cervical PROM. ? ?UE ROM: ? ?Active ROM Right ?12/08/2021 Left ?12/08/2021  ?Shoulder flexion 102 101  ?Shoulder extension    ?Shoulder abduction 101 99  ?Shoulder adduction    ?Shoulder extension    ?Shoulder internal rotation FER to occiput FER to occiput  ?Shoulder external rotation FIR to L4 FIR to L4  ?Elbow flexion    ?Elbow extension    ?Wrist flexion    ?Wrist extension    ?Wrist ulnar deviation    ?Wrist radial deviation    ?Wrist pronation    ?Wrist supination    ? (Blank rows = not tested) ? ?UE MMT: ? ?MMT Right ?12/08/2021 Left ?12/08/2021  ?Shoulder flexion 4 * 4 *  ?Shoulder extension    ?Shoulder abduction 4 * 4 *  ?Shoulder adduction    ?Shoulder extension    ?Shoulder internal rotation 4 4  ?Shoulder external rotation 3+ 4-  ?Middle trapezius    ?Lower trapezius    ?Elbow flexion    ?Elbow extension    ?Wrist flexion    ?Wrist extension    ?Wrist ulnar deviation    ?Wrist radial deviation    ?Wrist pronation    ?Wrist supination    ?Grip strength 9.67 lbs 14.67 lbs  ? (Blank rows = not tested) ?*Increased pain with resistance ? ?CERVICAL SPECIAL TESTS:  ?Spurling's test: Positive and Distraction test: Positive ? ? ?TODAY'S TREATMENT:  ?12/14/21: *** ? ?12/08/21: ?THERAPEUTIC EXERCISES: Instruction in HEP: ?Standing Cervical Retraction  - 3 x daily - 7 x weekly - 2 sets - 10 reps - 5 sec hold ?Seated Scapular Retraction with External Rotation  - 3 x daily - 7 x weekly - 2 sets - 10 reps - 3-5 sec hold ?Seated Shoulder Shrug Circles AROM Backward  - 3 x daily - 7 x weekly - 2 sets - 10 reps ?(Cervical and scapular retraction also attempted in hooklying but better tolerated in sitting) ? ?PATIENT EDUCATION:  ?12/14/21: *** ? ?12/08/21: ?Education details: PT eval findings, anticipated POC and initial HEP ?Person educated: Patient ?Education method: Explanation, Demonstration, Verbal cues, and  MedBridge code texted to pt ?Education comprehension: verbalized understanding, returned  demonstration, verbal cues required, and needs further education ? ? ?HOME EXERCISE PROGRAM: ?Access Code: [email or web address] (texted to patient) ? ?ASSESSMENT: ? ?CLINICAL IMPRESSION: ?Nashawn ***  ? ?Cherokee is a 41 y.o. male who was seen today for physical therapy evaluation and treatment for acute neck pain with cervical radiculopathy secondary to herniated disc. He reports insidious onset of lower neck and B UE radiculopathy ~2 weeks following acute exacerbation of his LBP. Pain severely limits motion of his neck in all directions as well as limits functional use of B UE. Deficits include neck and upper shoulder pain, B UE radicular pain, numbness and tingling, postural abnormality, severely limited cervical and B shoulder ROM, decreased flexibility, increased muscle tension and spasms, and decreased B UE and grip weakness. Pain, LOM and weakness interfere with all aspects of daily life including sleeping, positional tolerance, ADLs, driving and work (his job does not offer formal light duty but has been working with him to limit need for much lifting). Sabre will benefit from skilled PT to address above deficits to improve posture, restore pain free functional ROM and strength to improve tolerance for his normal daily activities.  ? ?OBJECTIVE IMPAIRMENTS decreased activity tolerance, decreased knowledge of condition, decreased ROM, decreased strength, increased fascial restrictions, impaired perceived functional ability, increased muscle spasms, impaired flexibility, impaired sensation, impaired UE functional use, improper body mechanics, postural dysfunction, and pain.  ? ?ACTIVITY LIMITATIONS community activity, driving, occupation, and yard work.  ? ?PERSONAL FACTORS Fitness, Past/current experiences, Profession, Time since onset of injury/illness/exacerbation, and 3+ comorbidities: Carpal tunnel syndrome, morbid obesity, back  pain, GERD are also affecting patient's functional outcome.  ? ? ?REHAB POTENTIAL: Good ? ? ?GOALS: ?Goals reviewed with patient? Yes ? ?SHORT TERM GOALS: Target date: 01/05/2022 ? ?Patient will be independe

## 2021-12-14 ENCOUNTER — Ambulatory Visit: Payer: BC Managed Care – PPO | Admitting: Physical Therapy

## 2021-12-15 ENCOUNTER — Ambulatory Visit: Payer: BC Managed Care – PPO

## 2021-12-22 NOTE — Therapy (Signed)
OUTPATIENT PHYSICAL THERAPY TREATMENT   Patient Name: Jack Peterson MRN: [id number] DOB:May 11, 1981, 41 y.o., male Today's Date: 12/24/2021   PT End of Session - 12/24/21 1110     Visit Number 2    Date for PT Re-Evaluation 02/02/22    Authorization Type [employer] - VL:30 ($50 copay)    PT Start Time 1110    PT Stop Time 1152    PT Time Calculation (min) 42 min    Activity Tolerance Patient tolerated treatment well    Behavior During Therapy WFL for tasks assessed/performed              Past Medical History:  Diagnosis Date   Degenerative disc disease at L5-S1 level    GERD (gastroesophageal reflux disease)    Nephrolithiasis    Ureterolithiasis    Past Surgical History:  Procedure Laterality Date   CHOLECYSTECTOMY N/A 03/03/2021   Procedure: LAPAROSCOPIC CHOLECYSTECTOMY;  Surgeon: Dwan Bolt, MD;  Location: [facility];  Service: General;  Laterality: N/A;   ESOPHAGEAL DILATION     LITHOTRIPSY Left 2016   TONSILLECTOMY     Patient Active Problem List   Diagnosis Date Noted   S/P laparoscopic cholecystectomy 03/23/2021   Morbid obesity ([place]) 02/20/2020   Carpal tunnel syndrome 10/08/2018   Neck pain 10/08/2018   Numbness of hand 09/25/2018   BRBPR (bright red blood per rectum) 09/12/2014   Routine general medical examination at a health care facility 08/23/2012   GERD 03/26/2010   BACK PAIN 03/26/2010   NEPHROLITHIASIS, HX OF 03/26/2010    PCP: Midge Minium, MD  REFERRING PROVIDER: Eleonore Chiquito, NP  REFERRING DIAG: [phone] (ICD-10-CM) - Radiculopathy, cervical region  THERAPY DIAG:  Radiculopathy, cervical region  Cervicalgia  Abnormal posture  Muscle weakness (generalized)  Other muscle spasm  RATIONALE FOR EVALUATION AND TREATMENT: Rehabilitation  ONSET DATE: ~11/24/21 (ED visit)  SUBJECTIVE:                                                                                                                                                                                                          SUBJECTIVE STATEMENT: Cabel reports some relief from [facility] with improving ROM but still having pain and UE numbness/tingling.  PERTINENT HISTORY:  Carpal tunnel syndrome, morbid obesity, back pain, GERD  PAIN:  Are you having pain? Yes: NPRS scale: 5/10 Pain location: B upper shoulders with numbness and tingling into B UE Pain description: btw sharp and dull  PRECAUTIONS: None  WEIGHT BEARING RESTRICTIONS No  FALLS:  Has patient fallen in  last 6 months? No  LIVING ENVIRONMENT: Lives with: lives with their spouse Lives in: House/apartment Stairs: Yes: Internal: 14 steps; on right going up and on left going up and External: 3 steps; none Has following equipment at home: None  OCCUPATION: FT Immunologist in manufacturing   PLOF: Independent, Vocation/Vocational requirements: lifting 50+ lbs daily up to a couple hrs per day, a lot of bending and twisting, and Leisure: golf, cover band, walking the dogs  PATIENT GOALS: "To try to alleviate the pain as much as possible so I can function normally."   OBJECTIVE: (objective measures completed at initial evaluation unless otherwise dated)  DIAGNOSTIC FINDINGS:  11/24/21 - Cervical MRI: Extruded disc fragment on the left at C5-6 with cord flattening and probable left C6 nerve root impingement. Mild to moderate spinal stenosis.  PATIENT SURVEYS:  FOTO = 35  COGNITION: Overall cognitive status: Within functional limits for tasks assessed  SENSATION: Fairly consistent B UE numbness and tingling from cervical radiculopathy but otherwise WFL  POSTURE:  Fwd head and flexed neck with rounded shoulders  PALPATION: Increased muscle tension and TTP in B cervical paraspinals, UT, LS and medial scapular muscles   CERVICAL ROM:   Active ROM A/PROM (deg) 12/08/2021  Flexion 14  Extension 10  Right lateral flexion 15  Left lateral flexion 9  Right rotation 25  Left  rotation 25   (Blank rows = not tested)  Patient very guarded with attempts at cervical PROM.  UE ROM:  Active ROM Right 12/08/2021 Left 12/08/2021  Shoulder flexion 102 101  Shoulder extension    Shoulder abduction 101 99  Shoulder adduction    Shoulder extension    Shoulder internal rotation FER to occiput FER to occiput  Shoulder external rotation FIR to L4 FIR to L4  Elbow flexion    Elbow extension    Wrist flexion    Wrist extension    Wrist ulnar deviation    Wrist radial deviation    Wrist pronation    Wrist supination     (Blank rows = not tested)  UE MMT:  MMT Right 12/08/2021 Left 12/08/2021  Shoulder flexion 4 * 4 *  Shoulder extension    Shoulder abduction 4 * 4 *  Shoulder adduction    Shoulder extension    Shoulder internal rotation 4 4  Shoulder external rotation 3+ 4-  Middle trapezius    Lower trapezius    Elbow flexion    Elbow extension    Wrist flexion    Wrist extension    Wrist ulnar deviation    Wrist radial deviation    Wrist pronation    Wrist supination    Grip strength 9.67 lbs 14.67 lbs   (Blank rows = not tested) *Increased pain with resistance  CERVICAL SPECIAL TESTS:  Spurling's test: Positive and Distraction test: Positive   TODAY'S TREATMENT:  12/14/21 THERAPEUTIC EXERCISE: to improve strength and mobility.  Verbal and tactile cues throughout for technique. UBE: L1.0 x 4 min (3 min fwd/1 min back) backwards liimted by increased pain Low/mid/high doorway pec stretch x 30 sec each - pt reporting mid postion most comfortable Standing red TB scap retraction + B shoulder ER with back along doorframe 10 x 3" Supine red TB B shoulder ER hooklying on pool noodle 10 x 3" Supine red TB B shoulder horizontal ABD hooklying on pool noodle 10 x 3" Supine red TB B shoulder horizontal ABD diagonals hooklying on pool noodle 10 x 3" B  UT and LS stretches 2 x 30" Mid-lower cervical extension SNAG with pillowcase x 10 B cervical rotation  SNAG with pillowcase x 10  MANUAL: to reduce pain/abnormal muscle tension and promote improved pain free ROM STM to B cervical paraspinals, scalenes, UT and LS Manual TPR to B UT and LS Manual UT and LS stretches x 30 sec each Gentle manual distraction/traction 3 x 30 sec - no reduction in UE radicular symptoms  but increased intensity of symptoms upon release  Gentle cervical PROM with slight distraction in all planes   12/08/21 THERAPEUTIC EXERCISES: Instruction in HEP: Standing Cervical Retraction  - 3 x daily - 7 x weekly - 2 sets - 10 reps - 5 sec hold Seated Scapular Retraction with External Rotation  - 3 x daily - 7 x weekly - 2 sets - 10 reps - 3-5 sec hold Seated Shoulder Shrug Circles AROM Backward  - 3 x daily - 7 x weekly - 2 sets - 10 reps (Cervical and scapular retraction also attempted in hooklying but better tolerated in sitting)  PATIENT EDUCATION:  Education details: HEP progression Person educated: Patient Education method: Explanation, Demonstration, Verbal cues, and MedBridge code previously texted to pt Education comprehension: verbalized understanding, returned demonstration, verbal cues required, and needs further education   HOME EXERCISE PROGRAM: Access Code: [email or web address] (texted to patient)  Exercises - Standing Cervical Retraction  - 3 x daily - 7 x weekly - 2 sets - 10 reps - 5 sec hold - Seated Scapular Retraction with External Rotation  - 3 x daily - 7 x weekly - 2 sets - 10 reps - 3-5 sec hold - Seated Shoulder Shrug Circles AROM Backward  - 3 x daily - 7 x weekly - 2 sets - 10 reps - Doorway Pec Stretch at 90 Degrees Abduction  - 2-3 x daily - 7 x weekly - 3 reps - 30 sec hold - Supine Shoulder External Rotation on Foam Roll with Theraband  - 1 x daily - 7 x weekly - 2 sets - 10 reps - 3 sec hold - Supine Shoulder Horizontal Abduction with Resistance  - 1 x daily - 7 x weekly - 2 sets - 10 reps - 3 sec hold - Seated Gentle Upper Trapezius Stretch  - 2-3 x  daily - 7 x weekly - 3 reps - 30 sec hold - Gentle Levator Scapulae Stretch  - 2-3 x daily - 7 x weekly - 3 reps - 30 sec hold - Mid-Lower Cervical Extension SNAG with Strap  - 1 x daily - 7 x weekly - 2 sets - 10 reps - 3 sec hold - Seated Assisted Cervical Rotation with Towel  - 1 x daily - 7 x weekly - 2 sets - 10 reps - 3 sec hold  Patient Education - Trigger Point Dry Needling  ASSESSMENT:  CLINICAL IMPRESSION: Mustafe reports some relief from [facility] with decreasing pain and improving tolerance for cervical ROM but still restricted at end ranges and still having B UE numbness and tingling. He persists with pronounced fwd head and rounded shoulder posture, therefore initial stretches and exercises targeting anterior stretching and posterior/postural strengthening. MT addressing increased muscle tension and promoting further ROM with decreased pain. No relief/reduction in UE radiculopathy noted with manual traction, therefore focused more on STM/TPR - he may benefit from DN if abnormal muscle tension persists. MT followed by further stretching and assisted cervical ROM. Pt noting some increased muscle soreness by end of session but denies  increased pain.  OBJECTIVE IMPAIRMENTS decreased activity tolerance, decreased knowledge of condition, decreased ROM, decreased strength, increased fascial restrictions, impaired perceived functional ability, increased muscle spasms, impaired flexibility, impaired sensation, impaired UE functional use, improper body mechanics, postural dysfunction, and pain.   ACTIVITY LIMITATIONS community activity, driving, occupation, and yard work.   PERSONAL FACTORS Fitness, Past/current experiences, Profession, Time since onset of injury/illness/exacerbation, and 3+ comorbidities: Carpal tunnel syndrome, morbid obesity, back pain, GERD are also affecting patient's functional outcome.    REHAB POTENTIAL: Good   GOALS: Goals reviewed with patient? Yes  SHORT TERM  GOALS: Target date: 01/05/2022  Patient will be independent with initial HEP Baseline: Initial HEP provided on eval Goal status: IN PROGRESS  2.  Improve posture and alignment with patient to demonstrate improved upright posture with posterior shoulder girdle engaged Baseline: Fwd head and flexed neck with rounded shoulders Goal status: IN PROGRESS  3.  Patient to report reduction in frequency and intensity of neck pain and UE radiculopathy by >/= 25% Baseline: 7/10 neck pain with near constant B UE radicular pain, numbness and tingling Goal status: IN PROGRESS  LONG TERM GOALS: Target date: 02/02/2022  Patient will be independent with ongoing/advanced HEP for self-management at home Baseline: Initial HEP provided on eval Goal status: IN PROGRESS  2.  Improve understanding of body mechanics and spine care with patient to verbalize and demo correct lifting techniques Baseline: Fwd head and flexed neck with rounded shoulders; Limited lifting tolerance due to increased pain Goal status: IN PROGRESS  3.  Patient to report reduction in frequency and intensity of neck pain and B UE radiculopathy by >/= 50-75% to allow for improved activity tolerance Baseline: 7/10 neck pain with near constant B UE radicular pain, numbness and tingling Goal status: IN PROGRESS  4.  Patient to improve cervical AROM to [facility] without pain provocation Baseline: Refer to above ROM chart Goal status: IN PROGRESS  5.  Patient will demonstrate improved B UE strength to >/= 4+ to 5/5 for functional UE use Baseline: Refer to above MMT chart Goal status: IN PROGRESS  6.  Patient will demonstrate improved B grip strength to >/= 45# for improved functional use of hands Baseline: Refer to above MMT chart Goal status: IN PROGRESS  7.  Patient to report ability to perform ADLs, household, and work-related tasks without limitation due to neck or UE radicular pain, LOM or weakness Baseline: Pt currently limited with all  ADLs and working in limited capacity due to pain. Goal status: IN PROGRESS  8.  Patient will improve neck FOTO to >/= 63 to demonstrate improving function Baseline: FOTO = 35 Goal status: IN PROGRESS   PLAN: PT FREQUENCY: 1-2x/week (Patient wishing to start with 1x/wk d/t $50 copay)  PT DURATION: 8 weeks  PLANNED INTERVENTIONS: Therapeutic exercises, Therapeutic activity, Neuromuscular re-education, Balance training, Gait training, Patient/Family education, Joint mobilization, Dry Needling, Electrical stimulation, Spinal mobilization, Cryotherapy, Moist heat, Taping, Vasopneumatic device, Traction, Ultrasound, and Manual therapy  PLAN FOR NEXT SESSION: Review and update HEP PRN; progress postural correction and strengthening; gentle cervical ROM; MT +/- DN as indicated to address abnormal muscle tension in cervical and upper shoulders; modalities PRN for pain   Percival Spanish, PT 12/24/2021, 12:27 PM

## 2021-12-24 ENCOUNTER — Ambulatory Visit: Payer: BC Managed Care – PPO | Admitting: Physical Therapy

## 2021-12-24 DIAGNOSIS — M6281 Muscle weakness (generalized): Secondary | ICD-10-CM | POA: Diagnosis not present

## 2021-12-24 DIAGNOSIS — R293 Abnormal posture: Secondary | ICD-10-CM | POA: Diagnosis not present

## 2021-12-24 DIAGNOSIS — M5412 Radiculopathy, cervical region: Secondary | ICD-10-CM

## 2021-12-24 DIAGNOSIS — M542 Cervicalgia: Secondary | ICD-10-CM | POA: Diagnosis not present

## 2021-12-24 DIAGNOSIS — M62838 Other muscle spasm: Secondary | ICD-10-CM

## 2021-12-28 ENCOUNTER — Telehealth: Payer: Self-pay

## 2021-12-28 DIAGNOSIS — M5412 Radiculopathy, cervical region: Secondary | ICD-10-CM | POA: Diagnosis not present

## 2021-12-28 DIAGNOSIS — Z6841 Body Mass Index (BMI) 40.0 and over, adult: Secondary | ICD-10-CM | POA: Diagnosis not present

## 2021-12-28 NOTE — Telephone Encounter (Signed)
Given to Dr.Tabori to review. Once reviewed the office visit notes will be scanned in to chart.

## 2022-01-03 ENCOUNTER — Encounter: Payer: BC Managed Care – PPO | Admitting: Physical Therapy

## 2022-01-06 ENCOUNTER — Ambulatory Visit: Payer: BC Managed Care – PPO | Attending: Student | Admitting: Physical Therapy

## 2022-01-06 ENCOUNTER — Encounter: Payer: Self-pay | Admitting: Physical Therapy

## 2022-01-06 DIAGNOSIS — M542 Cervicalgia: Secondary | ICD-10-CM | POA: Diagnosis not present

## 2022-01-06 DIAGNOSIS — M6281 Muscle weakness (generalized): Secondary | ICD-10-CM | POA: Insufficient documentation

## 2022-01-06 DIAGNOSIS — M62838 Other muscle spasm: Secondary | ICD-10-CM | POA: Diagnosis not present

## 2022-01-06 DIAGNOSIS — M5412 Radiculopathy, cervical region: Secondary | ICD-10-CM | POA: Insufficient documentation

## 2022-01-06 DIAGNOSIS — R293 Abnormal posture: Secondary | ICD-10-CM | POA: Insufficient documentation

## 2022-01-06 NOTE — Therapy (Signed)
OUTPATIENT PHYSICAL THERAPY TREATMENT   Patient Name: Jack Peterson MRN: [id number] DOB:06/29/1981, 41 y.o., male Today's Date: 01/06/2022   PT End of Session - 01/06/22 1104     Visit Number 3    Date for PT Re-Evaluation 02/02/22    Authorization Type [employer] - VL:30 ($50 copay)    PT Start Time 1104    PT Stop Time 1146    PT Time Calculation (min) 42 min    Activity Tolerance Patient tolerated treatment well    Behavior During Therapy WFL for tasks assessed/performed               Past Medical History:  Diagnosis Date   Degenerative disc disease at L5-S1 level    GERD (gastroesophageal reflux disease)    Nephrolithiasis    Ureterolithiasis    Past Surgical History:  Procedure Laterality Date   CHOLECYSTECTOMY N/A 03/03/2021   Procedure: LAPAROSCOPIC CHOLECYSTECTOMY;  Surgeon: Dwan Bolt, MD;  Location: [place];  Service: General;  Laterality: N/A;   ESOPHAGEAL DILATION     LITHOTRIPSY Left 2016   TONSILLECTOMY     Patient Active Problem List   Diagnosis Date Noted   S/P laparoscopic cholecystectomy 03/23/2021   Morbid obesity ([place]) 02/20/2020   Carpal tunnel syndrome 10/08/2018   Neck pain 10/08/2018   Numbness of hand 09/25/2018   BRBPR (bright red blood per rectum) 09/12/2014   Routine general medical examination at a health care facility 08/23/2012   GERD 03/26/2010   BACK PAIN 03/26/2010   NEPHROLITHIASIS, HX OF 03/26/2010    PCP: Midge Minium, MD  REFERRING PROVIDER: Eleonore Chiquito, NP  REFERRING DIAG: [phone] (ICD-10-CM) - Radiculopathy, cervical region  THERAPY DIAG:  Radiculopathy, cervical region  Cervicalgia  Abnormal posture  Muscle weakness (generalized)  Other muscle spasm  RATIONALE FOR EVALUATION AND TREATMENT: Rehabilitation  ONSET DATE: ~11/24/21 (ED visit)  SUBJECTIVE:                                                                                                                                                                                                          SUBJECTIVE STATEMENT: Jack Peterson reports some increased pain over that past few days, maybe from what he has been doing at work. HEP going well and he states he did obtain a pool noodle to use when performing the exercises at home.  PERTINENT HISTORY:  Carpal tunnel syndrome, morbid obesity, back pain, GERD  PAIN:  Are you having pain? Yes: NPRS scale:  6/10 Pain location: B lower cervical/upper thoracic and upper  shoulders with numbness and tingling into B UE Pain description: btw sharp and dull  PRECAUTIONS: None  WEIGHT BEARING RESTRICTIONS No  FALLS:  Has patient fallen in last 6 months? No  LIVING ENVIRONMENT: Lives with: lives with their spouse Lives in: House/apartment Stairs: Yes: Internal: 14 steps; on right going up and on left going up and External: 3 steps; none Has following equipment at home: None  OCCUPATION: FT Immunologist in manufacturing   PLOF: Independent, Vocation/Vocational requirements: lifting 50+ lbs daily up to a couple hrs per day, a lot of bending and twisting, and Leisure: golf, cover band, walking the dogs  PATIENT GOALS: "To try to alleviate the pain as much as possible so I can function normally."   OBJECTIVE: (objective measures completed at initial evaluation unless otherwise dated)  DIAGNOSTIC FINDINGS:  11/24/21 - Cervical MRI: Extruded disc fragment on the left at C5-6 with cord flattening and probable left C6 nerve root impingement. Mild to moderate spinal stenosis.  PATIENT SURVEYS:  FOTO = 35  COGNITION: Overall cognitive status: Within functional limits for tasks assessed  SENSATION: Fairly consistent B UE numbness and tingling from cervical radiculopathy but otherwise WFL  POSTURE:  Fwd head and flexed neck with rounded shoulders  PALPATION: Increased muscle tension and TTP in B cervical paraspinals, UT, LS and medial scapular muscles   CERVICAL ROM:    Active ROM A/PROM (deg) 12/08/2021  Flexion 14  Extension 10  Right lateral flexion 15  Left lateral flexion 9  Right rotation 25  Left rotation 25   (Blank rows = not tested)  Patient very guarded with attempts at cervical PROM.  UE ROM:  Active ROM Right 12/08/2021 Left 12/08/2021  Shoulder flexion 102 101  Shoulder extension    Shoulder abduction 101 99  Shoulder adduction    Shoulder extension    Shoulder internal rotation FER to occiput FER to occiput  Shoulder external rotation FIR to L4 FIR to L4  Elbow flexion    Elbow extension    Wrist flexion    Wrist extension    Wrist ulnar deviation    Wrist radial deviation    Wrist pronation    Wrist supination     (Blank rows = not tested)  UE MMT:  MMT Right 12/08/2021 Left 12/08/2021  Shoulder flexion 4 * 4 *  Shoulder extension    Shoulder abduction 4 * 4 *  Shoulder adduction    Shoulder extension    Shoulder internal rotation 4 4  Shoulder external rotation 3+ 4-  Middle trapezius    Lower trapezius    Elbow flexion    Elbow extension    Wrist flexion    Wrist extension    Wrist ulnar deviation    Wrist radial deviation    Wrist pronation    Wrist supination    Grip strength 9.67 lbs 14.67 lbs   (Blank rows = not tested) *Increased pain with resistance  CERVICAL SPECIAL TESTS:  Spurling's test: Positive and Distraction test: Positive   TODAY'S TREATMENT:   01/06/22 THERAPEUTIC EXERCISE: to improve strength and mobility.  Verbal and tactile cues throughout for technique. UE pulleys - flexion and scaption x 3 min each B UT and LS stretches 2 x 30" Supine red TB B shoulder horizontal ABD hooklying on pool noodle 10 x 3", 2 sets Supine red TB B shoulder ER hooklying on pool noodle 10 x 3", 2 sets Supine red TB B shoulder horizontal ABD diagonals hooklying on  pool noodle 10 x 3", 2 sets  MANUAL THERAPY: To promote normalized muscle tension, improved flexibility, improved joint mobility, increased  ROM, and reduced pain. STM to B cervical paraspinals, scalenes, UT and LS Trigger Point Dry-Needling  Treatment instructions: Expect mild to moderate muscle soreness. S/S of pneumothorax if dry needled over a lung field, and to seek immediate medical attention should they occur. Patient verbalized understanding of these instructions and education.  Patient Consent Given: Yes Education handout provided: Previously provided Muscles treated: B UT, LS, scalenes and cervical paraspinals Electrical stimulation performed: No Parameters: N/A Treatment response/outcome: Twitch Response Elicited and Palpable Increase in Muscle Length   12/14/21 THERAPEUTIC EXERCISE: to improve strength and mobility.  Verbal and tactile cues throughout for technique. UBE: L1.0 x 4 min (3 min fwd/1 min back) backwards liimted by increased pain Low/mid/high doorway pec stretch x 30 sec each - pt reporting mid postion most comfortable Standing red TB scap retraction + B shoulder ER with back along doorframe 10 x 3" Supine red TB B shoulder ER hooklying on pool noodle 10 x 3" Supine red TB B shoulder horizontal ABD hooklying on pool noodle 10 x 3" Supine red TB B shoulder horizontal ABD diagonals hooklying on pool noodle 10 x 3" B UT and LS stretches 2 x 30" Mid-lower cervical extension SNAG with pillowcase x 10 B cervical rotation SNAG with pillowcase x 10  MANUAL: to reduce pain/abnormal muscle tension and promote improved pain free ROM STM to B cervical paraspinals, scalenes, UT and LS Manual TPR to B UT and LS Manual UT and LS stretches x 30 sec each Gentle manual distraction/traction 3 x 30 sec - no reduction in UE radicular symptoms  but increased intensity of symptoms upon release  Gentle cervical PROM with slight distraction in all planes   12/08/21 THERAPEUTIC EXERCISES: Instruction in HEP: Standing Cervical Retraction  - 3 x daily - 7 x weekly - 2 sets - 10 reps - 5 sec hold Seated Scapular Retraction  with External Rotation  - 3 x daily - 7 x weekly - 2 sets - 10 reps - 3-5 sec hold Seated Shoulder Shrug Circles AROM Backward  - 3 x daily - 7 x weekly - 2 sets - 10 reps (Cervical and scapular retraction also attempted in hooklying but better tolerated in sitting)  PATIENT EDUCATION:  Education details: DN rational, procedure, outcomes, potential side effects, and recommended post-treatment exercises/activity   Person educated: Patient Education method: Explanation Education comprehension: verbalized understanding   HOME EXERCISE PROGRAM: Access Code: [email or web address] (texted to patient)  Exercises - Standing Cervical Retraction  - 3 x daily - 7 x weekly - 2 sets - 10 reps - 5 sec hold - Seated Scapular Retraction with External Rotation  - 3 x daily - 7 x weekly - 2 sets - 10 reps - 3-5 sec hold - Seated Shoulder Shrug Circles AROM Backward  - 3 x daily - 7 x weekly - 2 sets - 10 reps - Doorway Pec Stretch at 90 Degrees Abduction  - 2-3 x daily - 7 x weekly - 3 reps - 30 sec hold - Supine Shoulder External Rotation on Foam Roll with Theraband  - 1 x daily - 7 x weekly - 2 sets - 10 reps - 3 sec hold - Supine Shoulder Horizontal Abduction with Resistance  - 1 x daily - 7 x weekly - 2 sets - 10 reps - 3 sec hold - Seated Gentle Upper Trapezius Stretch  -  2-3 x daily - 7 x weekly - 3 reps - 30 sec hold - Gentle Levator Scapulae Stretch  - 2-3 x daily - 7 x weekly - 3 reps - 30 sec hold - Mid-Lower Cervical Extension SNAG with Strap  - 1 x daily - 7 x weekly - 2 sets - 10 reps - 3 sec hold - Seated Assisted Cervical Rotation with Towel  - 1 x daily - 7 x weekly - 2 sets - 10 reps - 3 sec hold  Patient Education - Trigger Point Dry Needling  ASSESSMENT:  CLINICAL IMPRESSION: Jaycion reports HEP going well and denies need for further review today - STG #1 met. Pain has been more elevated this week which he attributes to what he has been doing at work. Increased muscle tension still evident with  persistent fwd head and rounded/elevated shoulder posture. Given increased muscle tension we decided to proceed with trial of DN. After explanation of DN rational, procedures, outcomes and potential side effects, including precautions with DN over the lung fields, patient verbalized consent to DN treatment in conjunction with manual STM/DTM and TPR to reduce TTP/muscle tension. Muscles treated include B UT, LS, scalenes and cervical paraspinals. DN produced normal response with good twitches elicited resulting in palpable reduction in pain/ttp and muscle tension with pt noting improved ability to move his neck following DN. Pt educated to expect mild to moderate muscle soreness for up to 24-48 hrs and instructed to continue prescribed home exercise program and current activity level with pt verbalizing understanding of theses instructions. Session concluded with reinforcement of normalized muscle tension utilizing stretches and postural/scapular strengthening.  OBJECTIVE IMPAIRMENTS decreased activity tolerance, decreased knowledge of condition, decreased ROM, decreased strength, increased fascial restrictions, impaired perceived functional ability, increased muscle spasms, impaired flexibility, impaired sensation, impaired UE functional use, improper body mechanics, postural dysfunction, and pain.   ACTIVITY LIMITATIONS community activity, driving, occupation, and yard work.   PERSONAL FACTORS Fitness, Past/current experiences, Profession, Time since onset of injury/illness/exacerbation, and 3+ comorbidities: Carpal tunnel syndrome, morbid obesity, back pain, GERD are also affecting patient's functional outcome.    REHAB POTENTIAL: Good   GOALS: Goals reviewed with patient? Yes  SHORT TERM GOALS: Target date: 01/05/2022  Patient will be independent with initial HEP Baseline: Initial HEP provided on eval Goal status: MET  01/06/22  2.  Improve posture and alignment with patient to demonstrate  improved upright posture with posterior shoulder girdle engaged Baseline: Fwd head and flexed neck with rounded shoulders Goal status: IN PROGRESS  01/06/22 - fwd head and rounded/elevated shoulders persist but improving   3.  Patient to report reduction in frequency and intensity of neck pain and UE radiculopathy by >/= 25% Baseline: 7/10 neck pain with near constant B UE radicular pain, numbness and tingling Goal status: IN PROGRESS  01/06/22 - pain still 6/10   LONG TERM GOALS: Target date: 02/02/2022  Patient will be independent with ongoing/advanced HEP for self-management at home Baseline: Initial HEP provided on eval Goal status: IN PROGRESS  2.  Improve understanding of body mechanics and spine care with patient to verbalize and demo correct lifting techniques Baseline: Fwd head and flexed neck with rounded shoulders; Limited lifting tolerance due to increased pain Goal status: IN PROGRESS  3.  Patient to report reduction in frequency and intensity of neck pain and B UE radiculopathy by >/= 50-75% to allow for improved activity tolerance Baseline: 7/10 neck pain with near constant B UE radicular pain, numbness and tingling  Goal status: IN PROGRESS  4.  Patient to improve cervical AROM to [facility] without pain provocation Baseline: Refer to above ROM chart Goal status: IN PROGRESS  5.  Patient will demonstrate improved B UE strength to >/= 4+ to 5/5 for functional UE use Baseline: Refer to above MMT chart Goal status: IN PROGRESS  6.  Patient will demonstrate improved B grip strength to >/= 45# for improved functional use of hands Baseline: Refer to above MMT chart Goal status: IN PROGRESS  7.  Patient to report ability to perform ADLs, household, and work-related tasks without limitation due to neck or UE radicular pain, LOM or weakness Baseline: Pt currently limited with all ADLs and working in limited capacity due to pain. Goal status: IN PROGRESS  8.  Patient will improve neck  FOTO to >/= 63 to demonstrate improving function Baseline: FOTO = 35 Goal status: IN PROGRESS   PLAN: PT FREQUENCY: 1-2x/week (Patient wishing to start with 1x/wk d/t $50 copay)  PT DURATION: 8 weeks  PLANNED INTERVENTIONS: Therapeutic exercises, Therapeutic activity, Neuromuscular re-education, Balance training, Gait training, Patient/Family education, Joint mobilization, Dry Needling, Electrical stimulation, Spinal mobilization, Cryotherapy, Moist heat, Taping, Vasopneumatic device, Traction, Ultrasound, and Manual therapy  PLAN FOR NEXT SESSION: Assess response to DN; progress postural correction and strengthening; gentle cervical ROM; review and update HEP PRN; MT +/- DN as indicated to address abnormal muscle tension in cervical and upper shoulders; modalities PRN for pain   Percival Spanish, PT 01/06/2022, 12:14 PM

## 2022-01-10 ENCOUNTER — Ambulatory Visit: Payer: BC Managed Care – PPO | Admitting: Physical Therapy

## 2022-01-10 ENCOUNTER — Encounter: Payer: Self-pay | Admitting: Physical Therapy

## 2022-01-10 ENCOUNTER — Other Ambulatory Visit: Payer: Self-pay

## 2022-01-10 DIAGNOSIS — M62838 Other muscle spasm: Secondary | ICD-10-CM | POA: Diagnosis not present

## 2022-01-10 DIAGNOSIS — R293 Abnormal posture: Secondary | ICD-10-CM | POA: Diagnosis not present

## 2022-01-10 DIAGNOSIS — E559 Vitamin D deficiency, unspecified: Secondary | ICD-10-CM

## 2022-01-10 DIAGNOSIS — M6281 Muscle weakness (generalized): Secondary | ICD-10-CM | POA: Diagnosis not present

## 2022-01-10 DIAGNOSIS — M5412 Radiculopathy, cervical region: Secondary | ICD-10-CM

## 2022-01-10 DIAGNOSIS — M542 Cervicalgia: Secondary | ICD-10-CM | POA: Diagnosis not present

## 2022-01-10 MED ORDER — VITAMIN D (ERGOCALCIFEROL) 1.25 MG (50000 UNIT) PO CAPS: 50000.0000 [IU] | ORAL_CAPSULE | ORAL | 0 refills | Status: DC

## 2022-01-10 NOTE — Therapy (Addendum)
OUTPATIENT PHYSICAL THERAPY TREATMENT / DISCHARGE SUMMARY   Patient Name: Jack Peterson MRN: [id number] DOB:Oct 17, 1980, 41 y.o., male Today's Date: 01/10/2022   PT End of Session - 01/10/22 1617     Visit Number 4    Date for PT Re-Evaluation 02/02/22    Authorization Type BCBS - VL:30 ($50 copay)    PT Start Time 1617    PT Stop Time 1701    PT Time Calculation (min) 44 min    Activity Tolerance Patient tolerated treatment well    Behavior During Therapy WFL for tasks assessed/performed                Past Medical History:  Diagnosis Date   Degenerative disc disease at L5-S1 level    GERD (gastroesophageal reflux disease)    Nephrolithiasis    Ureterolithiasis    Past Surgical History:  Procedure Laterality Date   CHOLECYSTECTOMY N/A 03/03/2021   Procedure: LAPAROSCOPIC CHOLECYSTECTOMY;  Surgeon: Dwan Bolt, MD;  Location: [facility] OR;  Service: General;  Laterality: N/A;   ESOPHAGEAL DILATION     LITHOTRIPSY Left 2016   TONSILLECTOMY     Patient Active Problem List   Diagnosis Date Noted   S/P laparoscopic cholecystectomy 03/23/2021   Morbid obesity ([place]) 02/20/2020   Carpal tunnel syndrome 10/08/2018   Neck pain 10/08/2018   Numbness of hand 09/25/2018   BRBPR (bright red blood per rectum) 09/12/2014   Routine general medical examination at a health care facility 08/23/2012   GERD 03/26/2010   BACK PAIN 03/26/2010   NEPHROLITHIASIS, HX OF 03/26/2010    PCP: Midge Minium, MD  REFERRING PROVIDER: Eleonore Chiquito, NP  REFERRING DIAG: [id number] (ICD-10-CM) - Radiculopathy, cervical region  THERAPY DIAG:  Radiculopathy, cervical region  Cervicalgia  Abnormal posture  Muscle weakness (generalized)  Other muscle spasm  RATIONALE FOR EVALUATION AND TREATMENT: Rehabilitation  ONSET DATE: ~11/24/21 (ED visit)  SUBJECTIVE:                                                                                                                                                                                                          SUBJECTIVE STATEMENT: Jack Peterson reports feeling looser for a few days following the DN.  PERTINENT HISTORY:  Carpal tunnel syndrome, morbid obesity, back pain, GERD  PAIN:  Are you having pain? Yes: NPRS scale:  5/10 Pain location: B lower cervical/upper thoracic spine with numbness and tingling into B UE Pain description: btw sharp and dull  PRECAUTIONS: None  WEIGHT BEARING RESTRICTIONS No  FALLS:  Has  patient fallen in last 6 months? No  LIVING ENVIRONMENT: Lives with: lives with their spouse Lives in: House/apartment Stairs: Yes: Internal: 14 steps; on right going up and on left going up and External: 3 steps; none Has following equipment at home: None  OCCUPATION: FT Immunologist in manufacturing   PLOF: Independent, Vocation/Vocational requirements: lifting 50+ lbs daily up to a couple hrs per day, a lot of bending and twisting, and Leisure: golf, cover band, walking the dogs  PATIENT GOALS: "To try to alleviate the pain as much as possible so I can function normally."   OBJECTIVE: (objective measures completed at initial evaluation unless otherwise dated)  DIAGNOSTIC FINDINGS:  11/24/21 - Cervical MRI: Extruded disc fragment on the left at C5-6 with cord flattening and probable left C6 nerve root impingement. Mild to moderate spinal stenosis.  PATIENT SURVEYS:  FOTO = 35  COGNITION: Overall cognitive status: Within functional limits for tasks assessed  SENSATION: Fairly consistent B UE numbness and tingling from cervical radiculopathy but otherwise WFL  POSTURE:  Fwd head and flexed neck with rounded shoulders  PALPATION: Increased muscle tension and TTP in B cervical paraspinals, UT, LS and medial scapular muscles   CERVICAL ROM:   Active ROM AROM (deg) 12/08/2021 AROM (deg) 01/10/2022  Flexion 14 14  Extension 10 30  Right lateral flexion 15 14  Left lateral  flexion 9 12  Right rotation 25 47  Left rotation 25 30   (Blank rows = not tested)  Patient very guarded with attempts at cervical PROM.  UE ROM:  Active ROM Right 12/08/2021 Left 12/08/2021 Right 01/10/22 Left 01/10/22  Shoulder flexion 102 101 122 124  Shoulder extension      Shoulder abduction 101 99 90 89  Shoulder adduction      Shoulder extension      Shoulder internal rotation FER to occiput FER to occiput FER to occiput FER to occiput  Shoulder external rotation FIR to L4 FIR to L4 FIR to L1 FIR to L1  Elbow flexion      Elbow extension      Wrist flexion      Wrist extension      Wrist ulnar deviation      Wrist radial deviation      Wrist pronation      Wrist supination       (Blank rows = not tested)  UE MMT:  MMT Right 12/08/2021 Left 12/08/2021  Shoulder flexion 4 * 4 *  Shoulder extension    Shoulder abduction 4 * 4 *  Shoulder adduction    Shoulder extension    Shoulder internal rotation 4 4  Shoulder external rotation 3+ 4-  Middle trapezius    Lower trapezius    Elbow flexion    Elbow extension    Wrist flexion    Wrist extension    Wrist ulnar deviation    Wrist radial deviation    Wrist pronation    Wrist supination    Grip strength 9.67 lbs 14.67 lbs   (Blank rows = not tested) *Increased pain with resistance  CERVICAL SPECIAL TESTS:  Spurling's test: Positive and Distraction test: Positive   TODAY'S TREATMENT:   01/10/22 THERAPEUTIC EXERCISE: to improve strength and mobility.  Verbal and tactile cues throughout for technique. UE pulleys - flexion and scaption x 3 min each Mid-lower cervical extension SNAG with pillowcase x 10 B cervical rotation SNAG with pillowcase x 10   Supine red TB B shoulder horizontal  ABD hooklying on pool noodle 10 x 3", 2 sets Supine red TB B shoulder ER hooklying on pool noodle 10 x 3", 2 sets Supine red TB B shoulder horizontal ABD diagonals hooklying on pool noodle 10 x 3", 2 sets Supine red TB B scap  retraction & shoulder extension hooklying on pool noodle 10 x 3"  MANUAL THERAPY: To promote normalized muscle tension, improved flexibility, improved joint mobility, increased ROM, and reduced pain. STM to B cervical paraspinals, scalenes, UT and LS Gentle manual distraction/traction 2 x 30 sec - pt reporting increased pain with no reduction in UE radicular symptoms   Mid/lower cervical and upper thoracic CPAs and UPAs   01/06/22 THERAPEUTIC EXERCISE: to improve strength and mobility.  Verbal and tactile cues throughout for technique. UE pulleys - flexion and scaption x 3 min each B UT and LS stretches 2 x 30" Supine red TB B shoulder horizontal ABD hooklying on pool noodle 10 x 3", 2 sets Supine red TB B shoulder ER hooklying on pool noodle 10 x 3", 2 sets Supine red TB B shoulder horizontal ABD diagonals hooklying on pool noodle 10 x 3", 2 sets  MANUAL THERAPY: To promote normalized muscle tension, improved flexibility, improved joint mobility, increased ROM, and reduced pain. STM to B cervical paraspinals, scalenes, UT and LS Trigger Point Dry-Needling  Treatment instructions: Expect mild to moderate muscle soreness. S/S of pneumothorax if dry needled over a lung field, and to seek immediate medical attention should they occur. Patient verbalized understanding of these instructions and education.  Patient Consent Given: Yes Education handout provided: Previously provided Muscles treated: B UT, LS, scalenes and cervical paraspinals Electrical stimulation performed: No Parameters: N/A Treatment response/outcome: Twitch Response Elicited and Palpable Increase in Muscle Length   12/14/21 THERAPEUTIC EXERCISE: to improve strength and mobility.  Verbal and tactile cues throughout for technique. UBE: L1.0 x 4 min (3 min fwd/1 min back) backwards liimted by increased pain Low/mid/high doorway pec stretch x 30 sec each - pt reporting mid postion most comfortable Standing red TB scap  retraction + B shoulder ER with back along doorframe 10 x 3" Supine red TB B shoulder ER hooklying on pool noodle 10 x 3" Supine red TB B shoulder horizontal ABD hooklying on pool noodle 10 x 3" Supine red TB B shoulder horizontal ABD diagonals hooklying on pool noodle 10 x 3" B UT and LS stretches 2 x 30" Mid-lower cervical extension SNAG with pillowcase x 10 B cervical rotation SNAG with pillowcase x 10  MANUAL: to reduce pain/abnormal muscle tension and promote improved pain free ROM STM to B cervical paraspinals, scalenes, UT and LS Manual TPR to B UT and LS Manual UT and LS stretches x 30 sec each Gentle manual distraction/traction 3 x 30 sec - no reduction in UE radicular symptoms  but increased intensity of symptoms upon release  Gentle cervical PROM with slight distraction in all planes   PATIENT EDUCATION:  Education details: DN rational, procedure, outcomes, potential side effects, and recommended post-treatment exercises/activity   Person educated: Patient Education method: Explanation Education comprehension: verbalized understanding   HOME EXERCISE PROGRAM: Access Code: [email or web address] (texted to patient)  Exercises - Standing Cervical Retraction  - 3 x daily - 7 x weekly - 2 sets - 10 reps - 5 sec hold - Seated Scapular Retraction with External Rotation  - 3 x daily - 7 x weekly - 2 sets - 10 reps - 3-5 sec hold - Seated Shoulder Shrug Circles AROM  Backward  - 3 x daily - 7 x weekly - 2 sets - 10 reps - Doorway Pec Stretch at 90 Degrees Abduction  - 2-3 x daily - 7 x weekly - 3 reps - 30 sec hold - Supine Shoulder External Rotation on Foam Roll with Theraband  - 1 x daily - 7 x weekly - 2 sets - 10 reps - 3 sec hold - Supine Shoulder Horizontal Abduction with Resistance  - 1 x daily - 7 x weekly - 2 sets - 10 reps - 3 sec hold - Seated Gentle Upper Trapezius Stretch  - 2-3 x daily - 7 x weekly - 3 reps - 30 sec hold - Gentle Levator Scapulae Stretch  - 2-3 x daily - 7 x  weekly - 3 reps - 30 sec hold - Mid-Lower Cervical Extension SNAG with Strap  - 1 x daily - 7 x weekly - 2 sets - 10 reps - 3 sec hold - Seated Assisted Cervical Rotation with Towel  - 1 x daily - 7 x weekly - 2 sets - 10 reps - 3 sec hold  Patient Education - Trigger Point Dry Needling  ASSESSMENT:  CLINICAL IMPRESSION: Jack Peterson reports only minimal change (~10% reduction) in pain since start of PT with minimal to no change in UE radicular symptoms. Gains noted in cervical extension and R>L rotation as well as B shoulder flexion FIR, but otherwise cervical and B shoulder ROM remains limited in all directions. Frequent cues necessary to correct persistent fwd head and rounded/elevated shoulder posture when upright. He notes the greatest relief with gravity assisted stretches and scapular exercises over the pool noodle. Manual cervical traction reattempted but pt continues to report increased pain with no reduction in radicular symptoms. Attempted CPAs and UPAs to lower cervical and upper thoracic spine - significant restriction noted with some increased pain but pt noting that mobs seemed to he helping. He may benefit from further mobs as well as DN to cervical multifidi to improve vertebral joint mobility. Jack Peterson reports surgery indicated per MD, but still awaiting insurance authorization.  OBJECTIVE IMPAIRMENTS decreased activity tolerance, decreased knowledge of condition, decreased ROM, decreased strength, increased fascial restrictions, impaired perceived functional ability, increased muscle spasms, impaired flexibility, impaired sensation, impaired UE functional use, improper body mechanics, postural dysfunction, and pain.   ACTIVITY LIMITATIONS community activity, driving, occupation, and yard work.   PERSONAL FACTORS Fitness, Past/current experiences, Profession, Time since onset of injury/illness/exacerbation, and 3+ comorbidities: Carpal tunnel syndrome, morbid obesity, back pain, GERD are  also affecting patient's functional outcome.    REHAB POTENTIAL: Good   GOALS: Goals reviewed with patient? Yes  SHORT TERM GOALS: Target date: 01/05/2022  Patient will be independent with initial HEP Baseline: Initial HEP provided on eval Goal status: MET  01/06/22  2.  Improve posture and alignment with patient to demonstrate improved upright posture with posterior shoulder girdle engaged Baseline: Fwd head and flexed neck with rounded shoulders Goal status: IN PROGRESS  01/10/22 - fwd head and rounded/elevated shoulders persist but improving   3.  Patient to report reduction in frequency and intensity of neck pain and UE radiculopathy by >/= 25% Baseline: 7/10 neck pain with near constant B UE radicular pain, numbness and tingling Goal status: IN PROGRESS  01/10/22 - Pt reports 10% reduction pain with minimal to no change in radicular symptoms   LONG TERM GOALS: Target date: 02/02/2022  Patient will be independent with ongoing/advanced HEP for self-management at home Baseline: Initial HEP provided  on eval Goal status: IN PROGRESS  2.  Improve understanding of body mechanics and spine care with patient to verbalize and demo correct lifting techniques Baseline: Fwd head and flexed neck with rounded shoulders; Limited lifting tolerance due to increased pain Goal status: IN PROGRESS  3.  Patient to report reduction in frequency and intensity of neck pain and B UE radiculopathy by >/= 50-75% to allow for improved activity tolerance Baseline: 7/10 neck pain with near constant B UE radicular pain, numbness and tingling Goal status: IN PROGRESS  4.  Patient to improve cervical AROM to [facility] without pain provocation Baseline: Refer to above ROM chart Goal status: IN PROGRESS 01/11/11 - gains noted in cervical extension and R>L rotation  5.  Patient will demonstrate improved B UE strength to >/= 4+ to 5/5 for functional UE use Baseline: Refer to above MMT chart Goal status: IN PROGRESS      6.  Patient will demonstrate improved B grip strength to >/= 45# for improved functional use of hands Baseline: Refer to above MMT chart Goal status: IN PROGRESS 01/10/22 - grip strength not currently registering on the dynamometer   7.  Patient to report ability to perform ADLs, household, and work-related tasks without limitation due to neck or UE radicular pain, LOM or weakness Baseline: Pt currently limited with all ADLs and working in limited capacity due to pain. Goal status: IN PROGRESS  8.  Patient will improve neck FOTO to >/= 63 to demonstrate improving function Baseline: FOTO = 35 Goal status: IN PROGRESS   PLAN: PT FREQUENCY: 1-2x/week (Patient wishing to start with 1x/wk d/t $50 copay)  PT DURATION: 8 weeks  PLANNED INTERVENTIONS: Therapeutic exercises, Therapeutic activity, Neuromuscular re-education, Balance training, Gait training, Patient/Family education, Joint mobilization, Dry Needling, Electrical stimulation, Spinal mobilization, Cryotherapy, Moist heat, Taping, Vasopneumatic device, Traction, Ultrasound, and Manual therapy  PLAN FOR NEXT SESSION: progress postural correction and strengthening; gentle cervical ROM; review and update HEP PRN; cervical mobs; MT +/- DN as indicated to address abnormal muscle tension in cervical and upper shoulders; modalities PRN for pain   Percival Spanish, PT 01/10/2022, 5:29 PM    PHYSICAL THERAPY DISCHARGE SUMMARY  Visits from Start of Care: 4  Current functional level related to goals / functional outcomes:   Refer to above clinical impression and goal assessment for status as of last visit on 01/10/2022. Patient cancelled all remaining appointments stating he was going to have surgery, therefore will proceed with discharge from PT for this episode.    Remaining deficits:   As above.   Education / Equipment:   HEP   Patient agrees to discharge. Patient goals were partially met. Patient is being discharged due to  patient  going to have surgery.  Percival Spanish, PT, MPT 03/15/22, 9:57 AM  [facility] [address]  [place], [place], [address] Phone: [phone]   Fax:  [phone]

## 2022-01-17 ENCOUNTER — Ambulatory Visit: Payer: BC Managed Care – PPO | Admitting: Physical Therapy

## 2022-01-24 ENCOUNTER — Encounter: Payer: BC Managed Care – PPO | Admitting: Physical Therapy

## 2022-03-01 HISTORY — PX: CERVICAL DISCECTOMY: SHX98

## 2022-03-02 DIAGNOSIS — M4722 Other spondylosis with radiculopathy, cervical region: Secondary | ICD-10-CM | POA: Diagnosis not present

## 2022-03-02 DIAGNOSIS — M2578 Osteophyte, vertebrae: Secondary | ICD-10-CM | POA: Diagnosis not present

## 2022-03-02 DIAGNOSIS — M50122 Cervical disc disorder at C5-C6 level with radiculopathy: Secondary | ICD-10-CM | POA: Diagnosis not present

## 2022-03-02 DIAGNOSIS — M5412 Radiculopathy, cervical region: Secondary | ICD-10-CM | POA: Diagnosis not present

## 2022-04-14 DIAGNOSIS — M5412 Radiculopathy, cervical region: Secondary | ICD-10-CM | POA: Diagnosis not present

## 2022-06-17 ENCOUNTER — Other Ambulatory Visit: Payer: Self-pay

## 2022-06-17 ENCOUNTER — Telehealth: Payer: Self-pay | Admitting: Family Medicine

## 2022-06-17 DIAGNOSIS — N529 Male erectile dysfunction, unspecified: Secondary | ICD-10-CM

## 2022-06-17 MED ORDER — SILDENAFIL CITRATE 20 MG PO TABS: 20.0000 mg | ORAL_TABLET | Freq: Every day | ORAL | 1 refills | Status: DC

## 2022-06-17 NOTE — Telephone Encounter (Signed)
Refill sent to pharmacy.   

## 2022-06-17 NOTE — Telephone Encounter (Signed)
Encourage patient to contact the pharmacy for refills or they can request refills through [facility]  (Please schedule appointment if patient has not been seen in over a year)    WHAT PHARMACY WOULD THEY LIKE THIS SENT TO:  [facility] [id number] - [place], [place] - [address]    MEDICATION NAME & DOSE: Sildenafil 20 mg   NOTES/COMMENTS FROM PATIENT:      Front office please notify patient: It takes 48-72 hours to process rx refill requests Ask patient to call pharmacy to ensure rx is ready before heading there.

## 2022-07-07 DIAGNOSIS — J069 Acute upper respiratory infection, unspecified: Secondary | ICD-10-CM | POA: Diagnosis not present

## 2022-07-08 ENCOUNTER — Encounter: Payer: Self-pay | Admitting: Family Medicine

## 2022-07-08 ENCOUNTER — Telehealth (INDEPENDENT_AMBULATORY_CARE_PROVIDER_SITE_OTHER): Payer: BC Managed Care – PPO | Admitting: Family Medicine

## 2022-07-08 VITALS — Temp 98.2°F | Ht 71.0 in | Wt 315.0 lb

## 2022-07-08 DIAGNOSIS — J069 Acute upper respiratory infection, unspecified: Secondary | ICD-10-CM

## 2022-07-08 NOTE — Progress Notes (Signed)
Virtual Visit via Video Note  I connected withNAME@ on 07/08/22 at 10:00 AM EST by a video enabled telemedicine application 2/2 COVID-19 pandemic and verified that I am speaking with the correct person using two identifiers.  Location patient: home Location provider:work or home office Persons participating in the virtual visit: patient, provider  I discussed the limitations of evaluation and management by telemedicine and the availability of in person appointments. The patient expressed understanding and agreed to proceed. Chief Complaint  Patient presents with   Cough    Pt reports sx of coughing, bodyache, chills, nasal congestion. Started yesterday. Denied fever and fatigue. He continued he used teledoc app, was prescribed to take nasal spray and benzonatate 100mg  TID.      HPI: Pt is a 41 yo male with pmh sig for GED, carpal tunnel, obesity, followed by Dr. 53 and seen for acute concern. Yesterday am had cough and congestion.  Some aches and pain, rhinorrhea, dry cough, post nasal drainage. No fever. Last night chills This am sweating, temp normal.   Denies HA, ear pain/pressure, facial pain/pressure, sore throat, n/v, diarrhea. Possible sick contacts include coworkers.   Negative home covid test yesterday. Had Teledoc app yesterday, given tessalon and nasal spray.   ROS: See pertinent positives and negatives per HPI.  Past Medical History:  Diagnosis Date   Degenerative disc disease at L5-S1 level    GERD (gastroesophageal reflux disease)    Nephrolithiasis    Ureterolithiasis     Past Surgical History:  Procedure Laterality Date   CHOLECYSTECTOMY N/A 03/03/2021   Procedure: LAPAROSCOPIC CHOLECYSTECTOMY;  Surgeon: 05/03/2021, MD;  Location: [facility] OR;  Service: General;  Laterality: N/A;   ESOPHAGEAL DILATION     LITHOTRIPSY Left 2016   TONSILLECTOMY      Family History  Problem Relation Age of Onset   Hypertension Father    Cancer Father        prostate    Hypertension Maternal Grandmother    Cancer Paternal Grandfather        prostate   Cancer Paternal Great-grandfather        prostate   Diabetes Neg Hx    Stroke Neg Hx     Current Outpatient Medications:    benzonatate (TESSALON) 100 MG capsule, Take by mouth 3 (three) times daily., Disp: , Rfl:    fluticasone (FLONASE) 50 MCG/ACT nasal spray, Place 2 sprays into both nostrils daily as needed for allergies or rhinitis., Disp: , Rfl:    acetaminophen (TYLENOL) 500 MG tablet, Take 1,000 mg by mouth every 6 (six) hours as needed for moderate pain. (Patient not taking: Reported on 07/08/2022), Disp: , Rfl:    naproxen (NAPROSYN) 375 MG tablet, Take 1 tablet (375 mg total) by mouth 2 (two) times daily. (Patient not taking: Reported on 07/08/2022), Disp: 20 tablet, Rfl: 0   oxyCODONE-acetaminophen (PERCOCET/ROXICET) 5-325 MG tablet, Take 1 tablet by mouth every 6 (six) hours as needed for severe pain. (Patient not taking: Reported on 07/08/2022), Disp: 15 tablet, Rfl: 0   sildenafil (REVATIO) 20 MG tablet, Take 1-2 tablets (20-40 mg total) by mouth daily. (Patient not taking: Reported on 07/08/2022), Disp: 30 tablet, Rfl: 1   Vitamin D, Ergocalciferol, (DRISDOL) 1.25 MG (50000 UNIT) CAPS capsule, Take 1 capsule (50,000 Units total) by mouth every 7 (seven) days. (Patient not taking: Reported on 07/08/2022), Disp: 12 capsule, Rfl: 0  EXAM:  VITALS per patient if applicable: RR between 12-20 bpm  GENERAL: alert, oriented, appears  well and in no acute distress  HEENT: atraumatic, conjunctiva clear, no obvious abnormalities on inspection of external nose and ears  NECK: normal movements of the head and neck  LUNGS: on inspection no signs of respiratory distress, breathing rate appears normal, no obvious gross SOB, gasping or wheezing  CV: no obvious cyanosis  MS: moves all visible extremities without noticeable abnormality  PSYCH/NEURO: pleasant and cooperative, no obvious depression or anxiety,  speech and thought processing grossly intact  ASSESSMENT AND PLAN:  Discussed the following assessment and plan:  Viral URI with cough  COVID testing negative.  Consider influenza testing the symptoms relatively mild.  Continue supportive care with OTC medications, rest, hydration, nasal spray, Tessalon.  Given strict precautions.  Follow-up with PCP as needed   I discussed the assessment and treatment plan with the patient. The patient was provided an opportunity to ask questions and all were answered. The patient agreed with the plan and demonstrated an understanding of the instructions.   The patient was advised to call back or seek an in-person evaluation if the symptoms worsen or if the condition fails to improve as anticipated.   Deeann Saint, MD

## 2022-10-13 ENCOUNTER — Encounter: Payer: Self-pay | Admitting: Family Medicine

## 2022-10-13 ENCOUNTER — Ambulatory Visit (INDEPENDENT_AMBULATORY_CARE_PROVIDER_SITE_OTHER): Payer: BC Managed Care – PPO | Admitting: Family Medicine

## 2022-10-13 VITALS — BP 118/72 | HR 91 | Temp 98.6°F | Resp 17 | Ht 71.0 in | Wt 322.1 lb

## 2022-10-13 DIAGNOSIS — Z0000 Encounter for general adult medical examination without abnormal findings: Secondary | ICD-10-CM | POA: Diagnosis not present

## 2022-10-13 LAB — CBC WITH DIFFERENTIAL/PLATELET
Basophils Absolute: 0 10*3/uL (ref 0.0–0.1)
Basophils Relative: 0.9 % (ref 0.0–3.0)
Eosinophils Absolute: 0.2 10*3/uL (ref 0.0–0.7)
Eosinophils Relative: 4.3 % (ref 0.0–5.0)
HCT: 47.7 % (ref 39.0–52.0)
Hemoglobin: 16.7 g/dL (ref 13.0–17.0)
Lymphocytes Relative: 29 % (ref 12.0–46.0)
Lymphs Abs: 1.4 10*3/uL (ref 0.7–4.0)
MCHC: 35 g/dL (ref 30.0–36.0)
MCV: 88.1 fl (ref 78.0–100.0)
Monocytes Absolute: 0.4 10*3/uL (ref 0.1–1.0)
Monocytes Relative: 9.2 % (ref 3.0–12.0)
Neutro Abs: 2.7 10*3/uL (ref 1.4–7.7)
Neutrophils Relative %: 56.6 % (ref 43.0–77.0)
Platelets: 244 10*3/uL (ref 150.0–400.0)
RBC: 5.41 Mil/uL (ref 4.22–5.81)
RDW: 12.6 % (ref 11.5–15.5)
WBC: 4.7 10*3/uL (ref 4.0–10.5)

## 2022-10-13 LAB — BASIC METABOLIC PANEL
BUN: 18 mg/dL (ref 6–23)
CO2: 30 mEq/L (ref 19–32)
Calcium: 9.3 mg/dL (ref 8.4–10.5)
Chloride: 102 mEq/L (ref 96–112)
Creatinine, Ser: 0.88 mg/dL (ref 0.40–1.50)
GFR: 105.4 mL/min (ref >60.00)
Glucose, Bld: 100 mg/dL — ABNORMAL HIGH (ref 70–99)
Potassium: 4.6 mEq/L (ref 3.5–5.1)
Sodium: 138 mEq/L (ref 135–145)

## 2022-10-13 LAB — TSH: TSH: 1.18 u[IU]/mL (ref 0.35–5.50)

## 2022-10-13 LAB — HEPATIC FUNCTION PANEL
ALT: 21 U/L (ref 0–53)
AST: 16 U/L (ref 0–37)
Albumin: 4.3 g/dL (ref 3.5–5.2)
Alkaline Phosphatase: 67 U/L (ref 39–117)
Bilirubin, Direct: 0.2 mg/dL (ref 0.0–0.3)
Total Bilirubin: 1 mg/dL (ref 0.2–1.2)
Total Protein: 6.8 g/dL (ref 6.0–8.3)

## 2022-10-13 LAB — VITAMIN D 25 HYDROXY (VIT D DEFICIENCY, FRACTURES): VITD: 14.37 ng/mL — ABNORMAL LOW (ref 30.00–100.00)

## 2022-10-13 LAB — LIPID PANEL
Cholesterol: 142 mg/dL (ref 0–200)
HDL: 51 mg/dL (ref >39.00)
LDL Cholesterol: 80 mg/dL (ref 0–99)
NonHDL: 91.15
Total CHOL/HDL Ratio: 3
Triglycerides: 56 mg/dL (ref 0.0–149.0)
VLDL: 11.2 mg/dL (ref 0.0–40.0)

## 2022-10-13 LAB — HEMOGLOBIN A1C: Hgb A1c MFr Bld: 5.2 % (ref 4.6–6.5)

## 2022-10-13 NOTE — Assessment & Plan Note (Signed)
Pt's PE WNL w/ exception of BMI.  Pt will return for Tdap as he has to go to work this morning.  Check labs.  Anticipatory guidance provided.

## 2022-10-13 NOTE — Patient Instructions (Signed)
Follow up in 1 year or as needed We'll notify you of your lab results and make any changes if needed Continue to work on healthy diet and regular exercise- you can do it!! Call with any questions or concerns Stay Safe!  Stay Healthy!! Congrats on the new band!!!

## 2022-10-13 NOTE — Progress Notes (Signed)
   Subjective:    Patient ID: Jack Peterson, male    DOB: Aug 28, 1980, 42 y.o.   MRN: [id number]  HPI CPE- due for Tdap (wants to get another date).  No concerns today, doing well  Patient Care Team    Relationship Specialty Notifications Start End  Midge Minium, MD PCP - General   07/14/10     Health Maintenance  Topic Date Due   INFLUENZA VACCINE  10/30/2022 (Originally 03/01/2022)   Hepatitis C Screening  Completed   HIV Screening  Completed   HPV VACCINES  Aged Out   DTaP/Tdap/Td  Discontinued   COVID-19 Vaccine  Discontinued      Review of Systems Patient reports no vision/hearing changes, anorexia, fever ,adenopathy, persistant/recurrent hoarseness, swallowing issues, chest pain, palpitations, edema, persistant/recurrent cough, hemoptysis, dyspnea (rest,exertional, paroxysmal nocturnal), gastrointestinal  bleeding (melena, rectal bleeding), abdominal pain, excessive heart burn, GU symptoms (dysuria, hematuria, voiding/incontinence issues) syncope, focal weakness, memory loss, numbness & tingling, skin/hair/nail changes, depression, anxiety, abnormal bruising/bleeding, musculoskeletal symptoms/signs.     Objective:   Physical Exam General Appearance:    Alert, cooperative, no distress, appears stated age  Head:    Normocephalic, without obvious abnormality, atraumatic  Eyes:    PERRL, conjunctiva/corneas clear, EOM's intact both eyes       Ears:    Normal TM's and external ear canals, both ears  Nose:   Nares normal, septum midline, mucosa normal, no drainage   or sinus tenderness  Throat:   Lips, mucosa, and tongue normal; teeth and gums normal  Neck:   Supple, symmetrical, trachea midline, no adenopathy;       thyroid:  No enlargement/tenderness/nodules  Back:     Symmetric, no curvature, ROM normal, no CVA tenderness  Lungs:     Clear to auscultation bilaterally, respirations unlabored  Chest wall:    No tenderness or deformity  Heart:    Regular rate and  rhythm, S1 and S2 normal, no murmur, rub   or gallop  Abdomen:     Soft, non-tender, bowel sounds active all four quadrants,    no masses, no organomegaly  Genitalia:    deferred  Rectal:    Extremities:   Extremities normal, atraumatic, no cyanosis or edema  Pulses:   2+ and symmetric all extremities  Skin:   Skin color, texture, turgor normal, no rashes or lesions  Lymph nodes:   Cervical, supraclavicular, and axillary nodes normal  Neurologic:   CNII-XII intact. Normal strength, sensation and reflexes      throughout          Assessment & Plan:

## 2022-10-13 NOTE — Assessment & Plan Note (Signed)
Ongoing issue.  Pt is again working on diet and exercise.  Has lost 12 lbs in the last month.  Applauded his efforts.  Will check labs to risk stratify.

## 2022-10-14 ENCOUNTER — Other Ambulatory Visit: Payer: Self-pay

## 2022-10-14 ENCOUNTER — Telehealth: Payer: Self-pay

## 2022-10-14 DIAGNOSIS — E559 Vitamin D deficiency, unspecified: Secondary | ICD-10-CM

## 2022-10-14 MED ORDER — VITAMIN D (ERGOCALCIFEROL) 1.25 MG (50000 UNIT) PO CAPS
50000.0000 [IU] | ORAL_CAPSULE | ORAL | 12 refills | Status: DC
Start: 2022-10-14 — End: 2023-10-17

## 2022-10-14 NOTE — Telephone Encounter (Signed)
-----   Message from Midge Minium, MD sent at 10/14/2022  7:31 AM EDT ----- Labs look great w/ exception of low Vit D.  Based on this, we need to start 50,000 units weekly x12 weeks in addition to daily OTC supplement of at least 2000 units.

## 2022-10-14 NOTE — Telephone Encounter (Signed)
Left Lab results on pt VM and Vit D 50,000 unit has been sent in

## 2022-10-25 DIAGNOSIS — J069 Acute upper respiratory infection, unspecified: Secondary | ICD-10-CM | POA: Diagnosis not present

## 2022-10-31 ENCOUNTER — Other Ambulatory Visit: Payer: Self-pay

## 2022-10-31 ENCOUNTER — Telehealth: Payer: Self-pay | Admitting: Family Medicine

## 2022-10-31 DIAGNOSIS — N529 Male erectile dysfunction, unspecified: Secondary | ICD-10-CM

## 2022-10-31 MED ORDER — SILDENAFIL CITRATE 20 MG PO TABS: 20.0000 mg | ORAL_TABLET | Freq: Every day | ORAL | 1 refills | Status: DC

## 2022-10-31 NOTE — Telephone Encounter (Signed)
Encourage patient to contact the pharmacy for refills or they can request refills through [facility]  (Please schedule appointment if patient has not been seen in over a year)  Last OV is 10/13/22  WHAT PHARMACY WOULD THEY LIKE THIS SENT TO:  [place] [id number] - [place], [place] - [address]    MEDICATION NAME & DOSE:  sildenafil (REVATIO) 20 MG tablet   NOTES/COMMENTS FROM PATIENT:      [place] office please notify patient: It takes 48-72 hours to process rx refill requests Ask patient to call pharmacy to ensure rx is ready before heading there.

## 2022-10-31 NOTE — Telephone Encounter (Signed)
Patient is requesting a refill of the following medications: Requested Prescriptions    No prescriptions requested or ordered in this encounter    Date of patient request: 4/1/204 Last office visit: 10/13/2022 Date of last refill: 06/2022   [facility]  [phone]

## 2022-11-24 DIAGNOSIS — R21 Rash and other nonspecific skin eruption: Secondary | ICD-10-CM | POA: Diagnosis not present

## 2022-11-30 ENCOUNTER — Telehealth: Payer: Self-pay | Admitting: Family Medicine

## 2022-11-30 NOTE — Telephone Encounter (Signed)
Encourage patient to contact the pharmacy for refills or they can request refills through [facility]  (Please schedule appointment if patient has not been seen in over a year)  Last ov was 10/13/22  WHAT PHARMACY WOULD THEY LIKE THIS SENT TO:  [facility] [id number] - [place], [place] - [address]    MEDICATION NAME & DOSE:sildenafil (REVATIO) 20 MG tablet   NOTES/COMMENTS FROM PATIENT:      Front office please notify patient: It takes 48-72 hours to process rx refill requests Ask patient to call pharmacy to ensure rx is ready before heading there.

## 2022-11-30 NOTE — Telephone Encounter (Signed)
Called patient in regard to refill. It was sent in on 10/31/2022 with one refill, there should be one refill on file at the pharmacy. If the patient calls back please let them know they need to call the pharmacy for that refill

## 2022-12-02 DIAGNOSIS — R21 Rash and other nonspecific skin eruption: Secondary | ICD-10-CM | POA: Diagnosis not present

## 2022-12-02 DIAGNOSIS — Z6841 Body Mass Index (BMI) 40.0 and over, adult: Secondary | ICD-10-CM | POA: Diagnosis not present

## 2022-12-22 ENCOUNTER — Ambulatory Visit (HOSPITAL_BASED_OUTPATIENT_CLINIC_OR_DEPARTMENT_OTHER)
Admission: RE | Admit: 2022-12-22 | Discharge: 2022-12-22 | Disposition: A | Discharge status: Home / Self Care | Payer: BC Managed Care – PPO | Source: Ambulatory Visit | Attending: Family Medicine | Admitting: Family Medicine

## 2022-12-22 ENCOUNTER — Ambulatory Visit: Payer: BC Managed Care – PPO | Admitting: Family Medicine

## 2022-12-22 ENCOUNTER — Encounter: Payer: Self-pay | Admitting: Family Medicine

## 2022-12-22 VITALS — BP 124/84 | HR 78 | Temp 97.6°F | Resp 19 | Ht 71.0 in | Wt 320.4 lb

## 2022-12-22 DIAGNOSIS — R1031 Right lower quadrant pain: Secondary | ICD-10-CM | POA: Diagnosis not present

## 2022-12-22 DIAGNOSIS — N4889 Other specified disorders of penis: Secondary | ICD-10-CM | POA: Diagnosis not present

## 2022-12-22 DIAGNOSIS — G8929 Other chronic pain: Secondary | ICD-10-CM | POA: Insufficient documentation

## 2022-12-22 DIAGNOSIS — Z87442 Personal history of urinary calculi: Secondary | ICD-10-CM | POA: Diagnosis not present

## 2022-12-22 DIAGNOSIS — R109 Unspecified abdominal pain: Secondary | ICD-10-CM | POA: Diagnosis not present

## 2022-12-22 LAB — BASIC METABOLIC PANEL
BUN: 14 mg/dL (ref 6–23)
CO2: 28 mEq/L (ref 19–32)
Calcium: 9.4 mg/dL (ref 8.4–10.5)
Chloride: 103 mEq/L (ref 96–112)
Creatinine, Ser: 0.93 mg/dL (ref 0.40–1.50)
GFR: 100.51 mL/min (ref >60.00)
Glucose, Bld: 87 mg/dL (ref 70–99)
Potassium: 4.1 mEq/L (ref 3.5–5.1)
Sodium: 137 mEq/L (ref 135–145)

## 2022-12-22 LAB — POCT URINALYSIS DIPSTICK
Blood, UA: POSITIVE
Glucose, UA: NEGATIVE
Leukocytes, UA: NEGATIVE
Nitrite, UA: NEGATIVE
Protein, UA: POSITIVE — AB
Spec Grav, UA: 1.025 (ref 1.010–1.025)
Urobilinogen, UA: 0.2 E.U./dL
pH, UA: 6 (ref 5.0–8.0)

## 2022-12-22 MED ORDER — TAMSULOSIN HCL 0.4 MG PO CAPS
0.4000 mg | ORAL_CAPSULE | Freq: Every day | ORAL | 3 refills | Status: DC
Start: 2022-12-22 — End: 2023-09-15

## 2022-12-22 NOTE — Patient Instructions (Signed)
Follow up as needed or as scheduled Go to [facility] and get your xray We'll notify you of your lab results and make any changes if needed Drink LOTS of water Start the Flomax as directed Call with any questions or concerns Hang in there!!

## 2022-12-22 NOTE — Progress Notes (Signed)
   Subjective:    Patient ID: Jack Peterson, male    DOB: 12/19/1980, 42 y.o.   MRN: [id number]  HPI Possible kidney stone- pt reports some back pain 'for awhle'.  Last week developed some burning w/ urination but this has subsided.  Pt will have pain upon standing in L lower abd/groin/leg.  Can also occur w/ walking.  Pain will come and go.  No visible blood but urine has been dark.  Some urinary frequency.  Hx of kidney stones.  Pt reports some redness of tip of penis- appeared similar to contact rash.  Does not have a urologist   Review of Systems For ROS see HPI     Objective:   Physical Exam Vitals reviewed. Exam conducted with a chaperone present.  Constitutional:      General: He is not in acute distress.    Appearance: He is obese. He is not ill-appearing.  HENT:     Head: Normocephalic and atraumatic.  Abdominal:     General: There is no distension.     Palpations: Abdomen is soft.     Tenderness: There is no abdominal tenderness. There is no right CVA tenderness, left CVA tenderness, guarding or rebound.     Hernia: No hernia is present.  Genitourinary:    Penis: Normal.   Skin:    General: Skin is warm and dry.     Coloration: Skin is not pale.  Neurological:     General: No focal deficit present.     Mental Status: He is alert and oriented to person, place, and time.  Psychiatric:        Mood and Affect: Mood normal.        Behavior: Behavior normal.        Thought Content: Thought content normal.           Assessment & Plan:  Groin pain- new.  Pt w/ hx of kidney stones.  Not currently following w/ urology but needs to be.  Referral placed.  Will get KUB to assess for possible stone- although discussed that if the stone is not calcium based it will not be seen.  Pt reports this feels similar to previous stones and he does have microscopic blood in urine.  Will send urine for culture.  Start Flomax.  Encouraged fluid intake.  Reviewed supportive care and red  flags that should prompt return.  Pt expressed understanding and is in agreement w/ plan.   Penile irritation- no obvious rash present.  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  Suspect sweat/heat irritation.  Encouraged him to try gold bond powder rather than creams.  Pt expressed understanding and is in agreement w/ plan.

## 2022-12-23 ENCOUNTER — Telehealth: Payer: Self-pay

## 2022-12-23 ENCOUNTER — Encounter: Payer: Self-pay | Admitting: Family Medicine

## 2022-12-23 LAB — URINE CULTURE
MICRO NUMBER:: 14996270
Result:: NO GROWTH
SPECIMEN QUALITY:: ADEQUATE

## 2022-12-23 NOTE — Telephone Encounter (Signed)
-----   Message from Sheliah Hatch, MD sent at 12/23/2022  7:22 AM EDT ----- Labs look great!  No evidence of kidney issues

## 2022-12-23 NOTE — Telephone Encounter (Signed)
Left vm stating lab results  

## 2022-12-27 ENCOUNTER — Telehealth: Payer: Self-pay

## 2022-12-27 NOTE — Telephone Encounter (Signed)
Pt aware of lab results. He states his Xray showed a kidney stone and he has an apt w/ Urology next week

## 2022-12-27 NOTE — Telephone Encounter (Signed)
-----   Message from Sheliah Hatch, MD sent at 12/27/2022  7:35 AM EDT ----- No evidence of UTI.  How are you feeling?

## 2022-12-29 ENCOUNTER — Ambulatory Visit: Payer: BC Managed Care – PPO | Admitting: Family Medicine

## 2023-01-05 ENCOUNTER — Encounter: Payer: Self-pay | Admitting: Urology

## 2023-01-05 ENCOUNTER — Ambulatory Visit: Payer: BC Managed Care – PPO | Admitting: Urology

## 2023-01-05 ENCOUNTER — Encounter: Payer: BC Managed Care – PPO | Admitting: Urology

## 2023-01-05 VITALS — BP 139/90 | HR 76 | Ht 71.0 in | Wt 320.0 lb

## 2023-01-05 DIAGNOSIS — R109 Unspecified abdominal pain: Secondary | ICD-10-CM

## 2023-01-05 DIAGNOSIS — R3129 Other microscopic hematuria: Secondary | ICD-10-CM | POA: Diagnosis not present

## 2023-01-05 DIAGNOSIS — N200 Calculus of kidney: Secondary | ICD-10-CM | POA: Diagnosis not present

## 2023-01-05 LAB — MICROSCOPIC EXAMINATION
Cast Type: NONE SEEN
Casts: NONE SEEN /lpf
Crystal Type: NONE SEEN
Crystals: NONE SEEN
Epithelial Cells (non renal): NONE SEEN /hpf (ref 0–10)
Renal Epithel, UA: NONE SEEN /hpf
Trichomonas, UA: NONE SEEN
WBC, UA: NONE SEEN /hpf (ref 0–5)
Yeast, UA: NONE SEEN

## 2023-01-05 LAB — URINALYSIS, ROUTINE W REFLEX MICROSCOPIC
Bilirubin, UA: NEGATIVE
Glucose, UA: NEGATIVE
Ketones, UA: NEGATIVE
Leukocytes,UA: NEGATIVE
Nitrite, UA: NEGATIVE
Protein,UA: NEGATIVE
Specific Gravity, UA: 1.03 (ref 1.005–1.030)
Urobilinogen, Ur: 1 mg/dL (ref 0.2–1.0)
pH, UA: 6.5 (ref 5.0–7.5)

## 2023-01-05 NOTE — Progress Notes (Signed)
Assessment: 1. Nephrolithiasis   2. Microscopic hematuria   3. Flank pain; right     Plan: I personally reviewed the patient's chart including provider notes, lab and imaging results. I personally viewed the CT study from 10/28/2020 and a KUB from 12/26/2022 with results as noted below. I advised him that typically renal calculi do not cause pain as there is not associated obstruction.  Also, microscopic hematuria is not typically associated with renal stones. Given his microscopic hematuria and right flank pain, I have recommended further evaluation with a CT hematuria protocol. Return to office after CT done for discussion of results. I also discussed the potential role of cystoscopy in the evaluation of microscopic hematuria.  Chief Complaint:  Chief Complaint  Patient presents with   Nephrolithiasis    History of Present Illness:  Jack Peterson is a 42 y.o. male who is seen in consultation from Sheliah Hatch, MD for evaluation of right flank pain, history of nephrolithiasis, and microscopic hematuria.  He has a prior history of kidney stones, passing his first stone and undergoing shockwave lithotripsy for his second stone in 2016.  He noted intermittent right-sided flank pain approximately 3 months ago.  He has had occasional pain on the left side as well.  He reports the pain is mild.  He has not required any pain medication.  No fevers, chills, nausea, or vomiting.  No gross hematuria. CT abdomen and pelvis without contrast from 3/22 showed a 5 mm stone in the right kidney, no left nephrolithiasis. KUB from 12/26/2022 showed a 9 mm calcification in the lower pole of the right kidney. Urinalysis from 5/24 was positive for blood on dipstick, no micro. He reports mild lower urinary tract symptoms including frequency, urgency, nocturia x 2, and sensation of incomplete emptying. IPSS = 8 today.  Past Medical History:  Past Medical History:  Diagnosis Date   Degenerative  disc disease at L5-S1 level    GERD (gastroesophageal reflux disease)    Nephrolithiasis    Ureterolithiasis     Past Surgical History:  Past Surgical History:  Procedure Laterality Date   CERVICAL DISCECTOMY  03/2022   CHOLECYSTECTOMY N/A 03/03/2021   Procedure: LAPAROSCOPIC CHOLECYSTECTOMY;  Surgeon: Fritzi Mandes, MD;  Location: [facility] OR;  Service: General;  Laterality: N/A;   ESOPHAGEAL DILATION     LITHOTRIPSY Left 2016   TONSILLECTOMY      Allergies:  Allergies  Allergen Reactions   Tuna Oil [Fish Oil] Itching and Rash    Family History:  Family History  Problem Relation Age of Onset   Hypertension Father    Cancer Father        prostate   Hypertension Maternal Grandmother    Cancer Paternal Grandfather        prostate   Cancer Paternal Great-grandfather        prostate   Diabetes Neg Hx    Stroke Neg Hx     Social History:  Social History   Tobacco Use   Smoking status: Former    Types: Cigarettes    Quit date: 2021    Years since quitting: 3.4   Smokeless tobacco: Never  Vaping Use   Vaping Use: Never used  Substance Use Topics   Alcohol use: Yes    Alcohol/week: 2.0 - 3.0 standard drinks of alcohol    Types: 2 - 3 Standard drinks or equivalent per week    Comment: social   Drug use: No    Review  of symptoms:  Constitutional:  Negative for unexplained weight loss, night sweats, fever, chills ENT:  Negative for nose bleeds, sinus pain, painful swallowing CV:  Negative for chest pain, shortness of breath, exercise intolerance, palpitations, loss of consciousness Resp:  Negative for cough, wheezing, shortness of breath GI:  Negative for nausea, vomiting, diarrhea, bloody stools GU:  Positives noted in HPI; otherwise negative for gross hematuria, dysuria, urinary incontinence Neuro:  Negative for seizures, poor balance, limb weakness, slurred speech Psych:  Negative for lack of energy, depression, anxiety Endocrine:  Negative for polydipsia,  polyuria, symptoms of hypoglycemia (dizziness, hunger, sweating) Hematologic:  Negative for anemia, purpura, petechia, prolonged or excessive bleeding, use of anticoagulants  Allergic:  Negative for difficulty breathing or choking as a result of exposure to anything; no shellfish allergy; no allergic response (rash/itch) to materials, foods  Physical exam: BP (!) 139/90   Pulse 76   Ht 5\' 11"  (1.803 m)   Wt (!) 320 lb (145.2 kg)   BMI 44.63 kg/m  GENERAL APPEARANCE:  Well appearing, well developed, well nourished, NAD HEENT: Atraumatic, Normocephalic, oropharynx clear. NECK: Supple without lymphadenopathy or thyromegaly. LUNGS: Clear to auscultation bilaterally. HEART: Regular Rate and Rhythm without murmurs, gallops, or rubs. ABDOMEN: Soft, non-tender, No Masses. EXTREMITIES: Moves all extremities well.  Without clubbing, cyanosis, or edema. NEUROLOGIC:  Alert and oriented x 3, normal gait, CN II-XII grossly intact.  MENTAL STATUS:  Appropriate. BACK:  Non-tender to palpation.  No CVAT SKIN:  Warm, dry and intact.    Results: U/A:  3-10 RBC

## 2023-01-05 NOTE — Addendum Note (Signed)
Addended by: Carolin Coy on: 01/05/2023 04:01 PM   Modules accepted: Orders

## 2023-01-09 ENCOUNTER — Telehealth: Payer: Self-pay | Admitting: Urology

## 2023-01-09 ENCOUNTER — Encounter: Payer: Self-pay | Admitting: Urology

## 2023-01-09 NOTE — Telephone Encounter (Signed)
Patient got your message and was returning your call. # [phone]

## 2023-01-09 NOTE — Telephone Encounter (Signed)
Left msg for a return call from patient regarding questions he has.

## 2023-01-09 NOTE — Telephone Encounter (Signed)
Spoke with patient, he was concerned with the few bacteria and mucus that showed on the microscopic evaluation. Advised pt this was normal and he verbalized understanding.

## 2023-01-09 NOTE — Telephone Encounter (Signed)
Patient saw results from urinalysis in mychart and was concerned and had a few questions. If someone could call him back. [phone]

## 2023-02-07 ENCOUNTER — Ambulatory Visit
Admission: RE | Admit: 2023-02-07 | Discharge: 2023-02-07 | Disposition: A | Discharge status: Home / Self Care | Payer: BC Managed Care – PPO | Source: Ambulatory Visit | Attending: Urology | Admitting: Urology

## 2023-02-07 DIAGNOSIS — K760 Fatty (change of) liver, not elsewhere classified: Secondary | ICD-10-CM | POA: Diagnosis not present

## 2023-02-07 DIAGNOSIS — N200 Calculus of kidney: Secondary | ICD-10-CM | POA: Diagnosis not present

## 2023-02-07 DIAGNOSIS — R3129 Other microscopic hematuria: Secondary | ICD-10-CM

## 2023-02-07 DIAGNOSIS — R109 Unspecified abdominal pain: Secondary | ICD-10-CM

## 2023-02-07 MED ORDER — IOPAMIDOL (ISOVUE-300) INJECTION 61%
100.0000 mL | Freq: Once | INTRAVENOUS | Status: AC | PRN
  Administered 2023-02-07: 100 mL via INTRAVENOUS

## 2023-02-08 ENCOUNTER — Encounter: Payer: Self-pay | Admitting: Urology

## 2023-02-13 ENCOUNTER — Other Ambulatory Visit: Payer: Self-pay

## 2023-02-13 DIAGNOSIS — N529 Male erectile dysfunction, unspecified: Secondary | ICD-10-CM

## 2023-02-13 MED ORDER — SILDENAFIL CITRATE 20 MG PO TABS
20.0000 mg | ORAL_TABLET | Freq: Every day | ORAL | 1 refills | Status: DC
Start: 2023-02-13 — End: 2023-05-16

## 2023-02-27 ENCOUNTER — Ambulatory Visit: Payer: BC Managed Care – PPO | Admitting: Urology

## 2023-02-27 ENCOUNTER — Encounter: Payer: Self-pay | Admitting: Urology

## 2023-02-27 VITALS — BP 160/110 | HR 82

## 2023-02-27 DIAGNOSIS — N200 Calculus of kidney: Secondary | ICD-10-CM | POA: Diagnosis not present

## 2023-02-27 DIAGNOSIS — R3129 Other microscopic hematuria: Secondary | ICD-10-CM | POA: Diagnosis not present

## 2023-02-27 LAB — URINALYSIS, ROUTINE W REFLEX MICROSCOPIC
Bilirubin, UA: NEGATIVE
Glucose, UA: NEGATIVE
Ketones, UA: NEGATIVE
Leukocytes,UA: NEGATIVE
Nitrite, UA: NEGATIVE
Specific Gravity, UA: 1.03 (ref 1.005–1.030)
Urobilinogen, Ur: 0.2 mg/dL (ref 0.2–1.0)
pH, UA: 7 (ref 5.0–7.5)

## 2023-02-27 LAB — MICROSCOPIC EXAMINATION

## 2023-02-27 MED ORDER — CIPROFLOXACIN HCL 500 MG PO TABS
500.0000 mg | ORAL_TABLET | Freq: Once | ORAL | Status: AC
Start: 2023-02-27 — End: 2023-02-27
  Administered 2023-02-27: 500 mg via ORAL

## 2023-02-27 NOTE — Progress Notes (Signed)
Assessment: 1. Microscopic hematuria; negative evaluation 7/24   2. Nephrolithiasis     Plan: I personally reviewed the CT study from 02/07/2023 with results as noted below. CT results discussed with the patient today. I also discussed the negative cystoscopy results with the patient.  There are no serious or life-threatening causes for his microscopic hematuria. Cipro x 1 following cystoscopy. Return to office in 6 months Stone prevention discussed KUB next visit   Chief Complaint:  Chief Complaint  Patient presents with   Cysto    History of Present Illness:  Jack Peterson is a 42 y.o. male who is seen for further evaluation of right flank pain, history of nephrolithiasis, and microscopic hematuria.  He has a prior history of kidney stones, passing his first stone and undergoing shockwave lithotripsy for his second stone in 2016.  He noted intermittent right-sided flank pain approximately 3 months ago.  He has had occasional pain on the left side as well.  He reports the pain is mild.  He has not required any pain medication.  No fevers, chills, nausea, or vomiting.  No gross hematuria. CT abdomen and pelvis without contrast from 3/22 showed a 5 mm stone in the right kidney, no left nephrolithiasis. KUB from 12/26/2022 showed a 9 mm calcification in the lower pole of the right kidney. Urinalysis from 5/24 was positive for blood on dipstick, no micro. He reports mild lower urinary tract symptoms including frequency, urgency, nocturia x 2, and sensation of incomplete emptying. IPSS = 8. 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  He presents today for cystoscopy.  No new urinary symptoms.  No dysuria or gross hematuria. IPSS = 6 today.  Portions of the above documentation were copied from a prior visit for  review purposes only.  Past Medical History:  Past Medical History:  Diagnosis Date   Degenerative disc disease at L5-S1 level    GERD (gastroesophageal reflux disease)    Nephrolithiasis    Ureterolithiasis     Past Surgical History:  Past Surgical History:  Procedure Laterality Date   CERVICAL DISCECTOMY  03/2022   CHOLECYSTECTOMY N/A 03/03/2021   Procedure: LAPAROSCOPIC CHOLECYSTECTOMY;  Surgeon: Fritzi Mandes, MD;  Location: [facility] OR;  Service: General;  Laterality: N/A;   ESOPHAGEAL DILATION     LITHOTRIPSY Left 2016   TONSILLECTOMY      Allergies:  Allergies  Allergen Reactions   Tuna Oil [Fish Oil] Itching and Rash    Family History:  Family History  Problem Relation Age of Onset   Hypertension Father    Cancer Father        prostate   Hypertension Maternal Grandmother    Cancer Paternal Grandfather        prostate   Cancer Paternal Great-grandfather        prostate   Diabetes Neg Hx    Stroke Neg Hx     Social History:  Social History   Tobacco Use   Smoking status: Former    Current packs/day: 0.00    Types: Cigarettes    Quit date: 2021    Years since quitting: 3.5   Smokeless tobacco: Never  Vaping Use   Vaping status: Never Used  Substance Use Topics   Alcohol use: Yes    Alcohol/week: 2.0 - 3.0 standard drinks of alcohol  Types: 2 - 3 Standard drinks or equivalent per week    Comment: social   Drug use: No    ROS: Constitutional:  Negative for fever, chills, weight loss CV: Negative for chest pain, previous MI, hypertension Respiratory:  Negative for shortness of breath, wheezing, sleep apnea, frequent cough GI:  Negative for nausea, vomiting, bloody stool, GERD  Physical exam: BP (!) 160/110   Pulse 82  GENERAL APPEARANCE:  Well appearing, well developed, well nourished, NAD HEENT:  Atraumatic, normocephalic, oropharynx clear NECK:  Supple without lymphadenopathy or thyromegaly ABDOMEN:  Soft, non-tender, no  masses EXTREMITIES:  Moves all extremities well, without clubbing, cyanosis, or edema NEUROLOGIC:  Alert and oriented x 3, normal gait, CN II-XII grossly intact MENTAL STATUS:  appropriate BACK:  Non-tender to palpation, No CVAT SKIN:  Warm, dry, and intact  Results: U/A:  3-10 RBC  Procedure:  Flexible Cystourethroscopy  Pre-operative Diagnosis: Microscopic hematuria  Post-operative Diagnosis: Microscopic hematuria  Anesthesia:  local with lidocaine jelly  Surgical Narrative:  After appropriate informed consent was obtained, the patient was prepped and draped in the usual sterile fashion in the supine position.  The patient was correctly identified and the proper procedure delineated prior to proceeding.  Sterile lidocaine gel was instilled in the urethra. The flexible cystoscope was introduced without difficulty.  Findings:  Anterior urethra: Normal  Posterior urethra: Lateral lobe hypertrophy  Bladder: Normal  Ureteral orifices:  single ureteral orifice bilaterally  Additional findings: none  Saline bladder wash for cytology was not performed.    The cystoscope was then removed.  The patient tolerated the procedure well.

## 2023-02-27 NOTE — Addendum Note (Signed)
Addended by: Carolin Coy on: 02/27/2023 12:01 PM   Modules accepted: Orders

## 2023-03-08 DIAGNOSIS — M5126 Other intervertebral disc displacement, lumbar region: Secondary | ICD-10-CM | POA: Diagnosis not present

## 2023-03-08 DIAGNOSIS — M47816 Spondylosis without myelopathy or radiculopathy, lumbar region: Secondary | ICD-10-CM | POA: Diagnosis not present

## 2023-03-08 DIAGNOSIS — M5136 Other intervertebral disc degeneration, lumbar region: Secondary | ICD-10-CM | POA: Diagnosis not present

## 2023-03-29 DIAGNOSIS — H6121 Impacted cerumen, right ear: Secondary | ICD-10-CM | POA: Diagnosis not present

## 2023-03-29 DIAGNOSIS — H6691 Otitis media, unspecified, right ear: Secondary | ICD-10-CM | POA: Diagnosis not present

## 2023-03-29 DIAGNOSIS — H60593 Other noninfective acute otitis externa, bilateral: Secondary | ICD-10-CM | POA: Diagnosis not present

## 2023-04-15 IMAGING — CR DG LUMBAR SPINE COMPLETE 4+V
5 series · 5 of 5 positions shown · non-contrast
Comparison: CT abdomen/pelvis 10/28/2020, lumbar spine MRI
10/06/2018

CLINICAL DATA: Lower back pain for 2 weeks, mid back pain in arms
since yesterday, history of degenerative disc disease

EXAM:
LUMBAR SPINE - COMPLETE 4+ VIEW; THORACIC SPINE 2 VIEWS; CERVICAL
SPINE - COMPLETE 4+ VIEW

[l-spine ap]
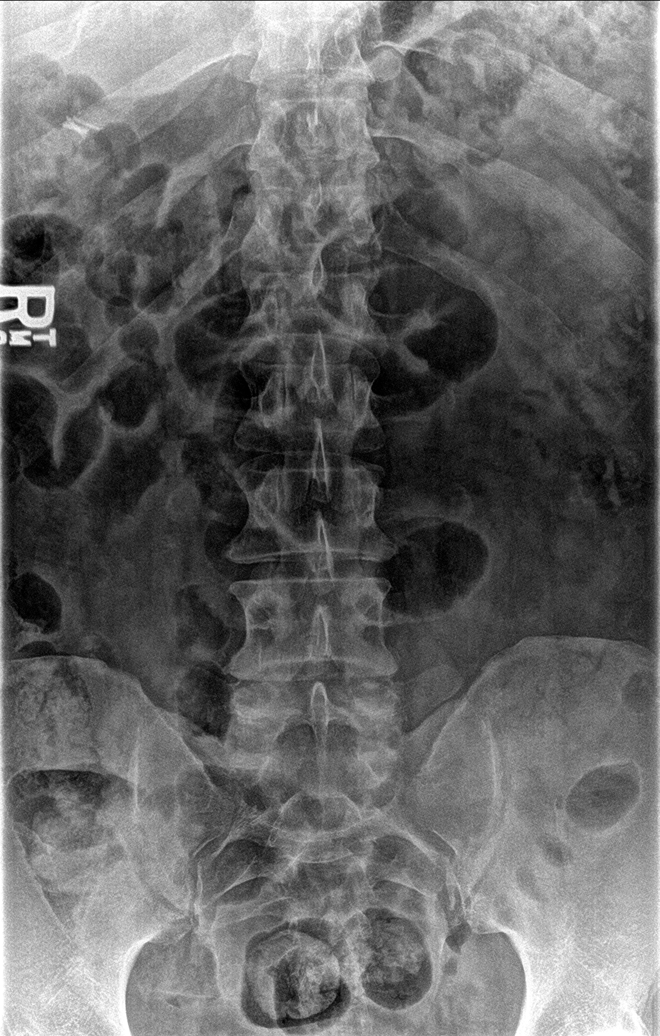

[l-spine obl (1 of 2)]
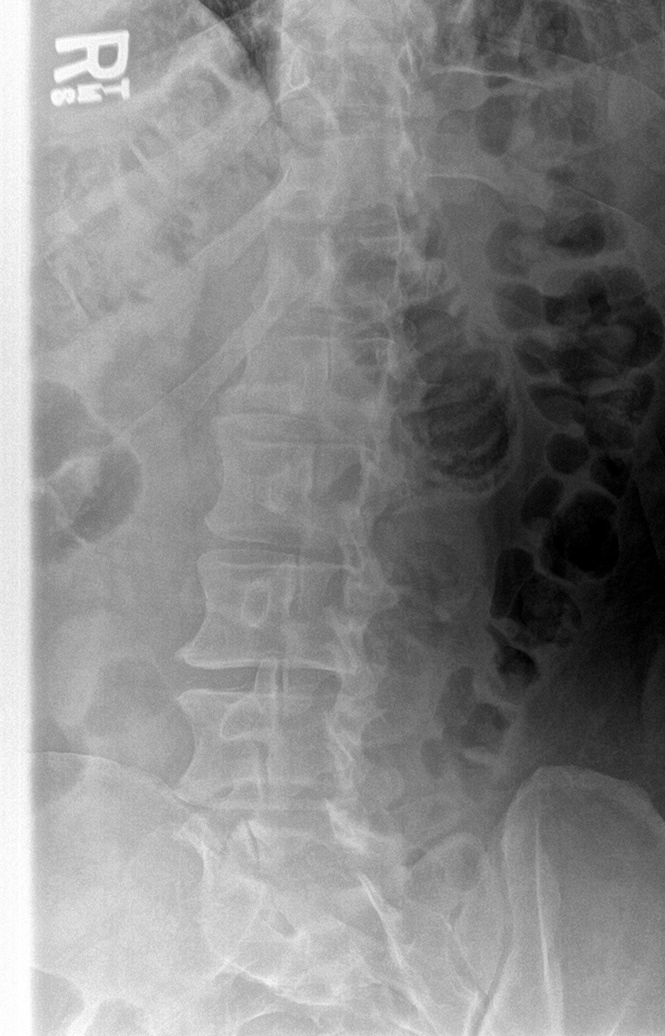

[l-spine lat]
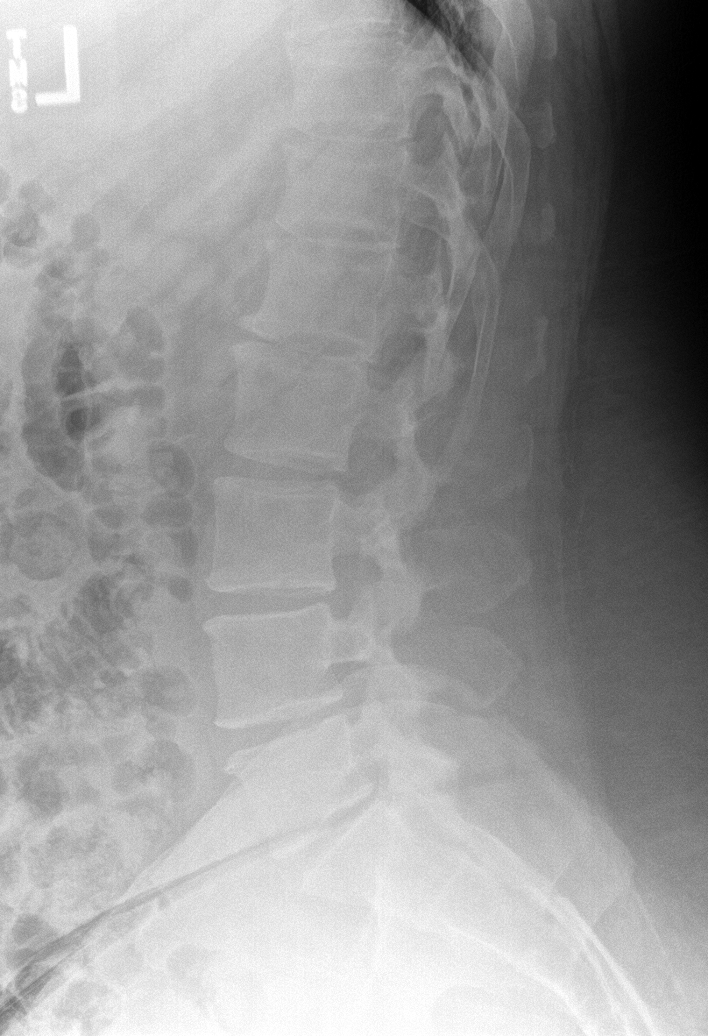

[l-spine spot]
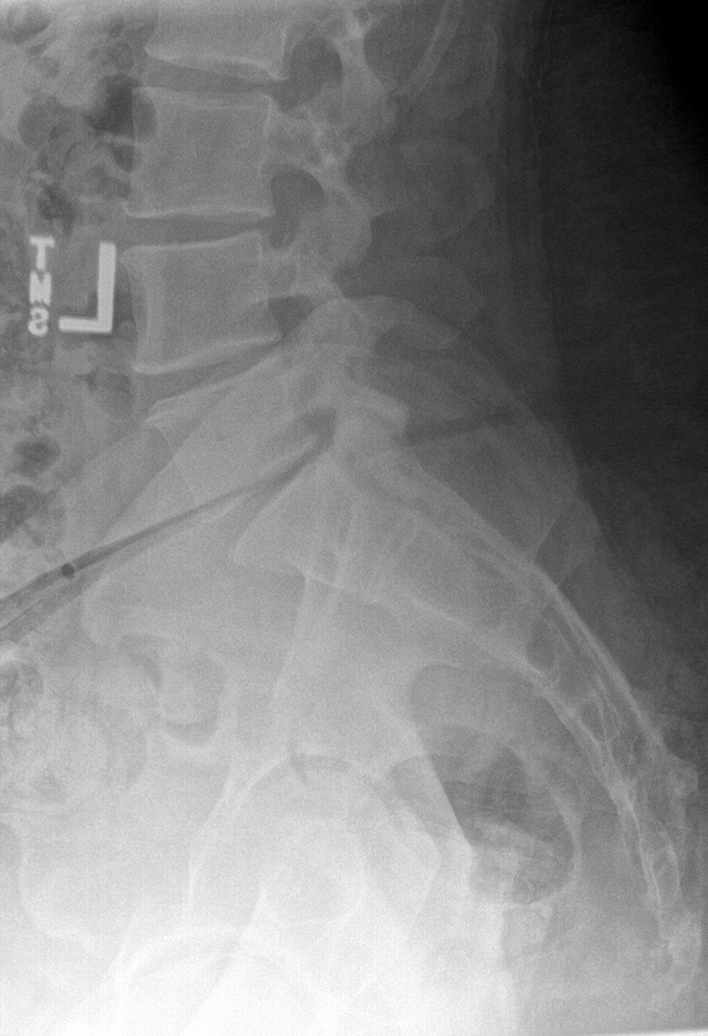

[l-spine obl (2 of 2)]
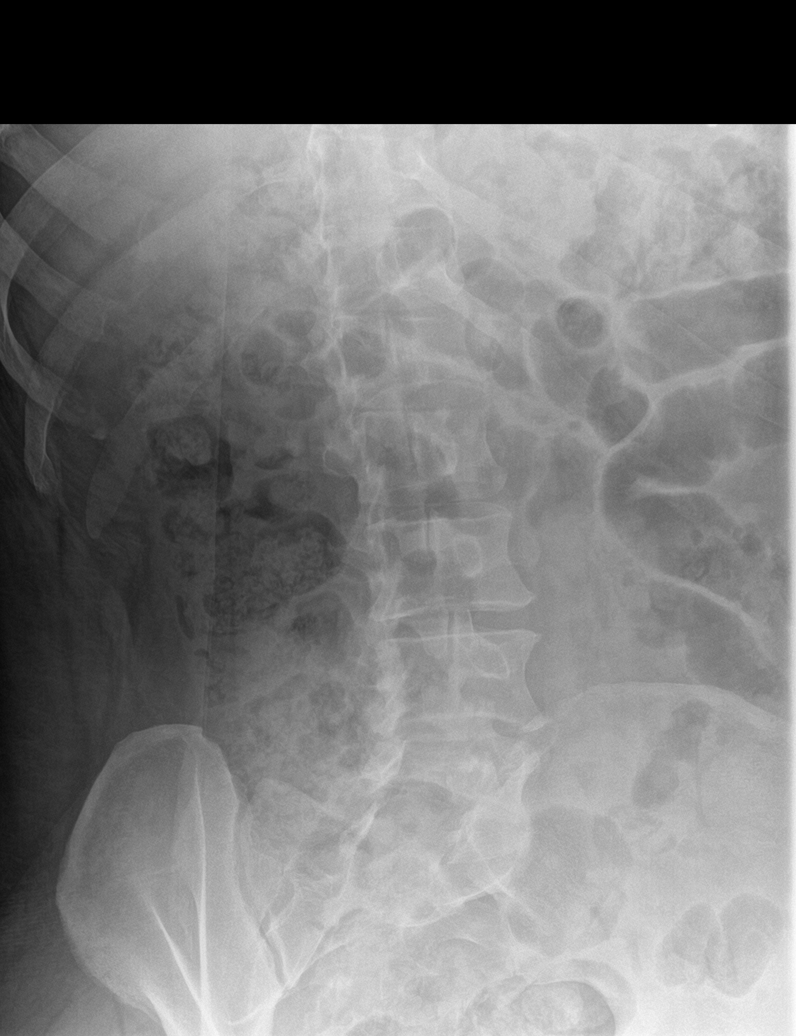

[5 of 5 positions shown; findings below may reference images not displayed]

FINDINGS: Cervical:

The cervical spine is imaged through the C7 vertebral body in the
lateral projection. There is straightening of the normal cervical
lordosis. There is no antero or retrolisthesis. Vertebral body
heights are preserved, without evidence of acute injury. The disc
heights are overall preserved, with minimal degenerative endplate
change at C4-C5 through C6-C7. The neural foramina appear patent.
The prevertebral soft tissues are unremarkable.

Thoracic:

The upper thoracic vertebral bodies are suboptimally assessed due to
overlying structures. The imaged thoracic vertebral body heights are
preserved, without evidence of acute injury. There is minimal
dextroscoliosis centered at T7. Sagittal alignment is normal, with
no antero or retrolisthesis. The disc heights are preserved. There
is no significant degenerative change.

The imaged heart and lungs unremarkable.

Lumbar:

There are 5 non-rib-bearing lumbar type vertebral bodies. Vertebral
body heights are preserved, without evidence of acute injury.
Alignment is normal. There is no evidence of spondylolysis. There is
mild disc space narrowing at L4-L5 and L5-S1 with associated
degenerative endplate change. There is mild facet arthropathy at
L5-S1.

The SI joints are intact.  Cholecystectomy clips are noted.
IMPRESSION: 1. No acute finding in the cervical, thoracic, or lumbar spine.
2. Minimal degenerative endplate change in the cervical spine. The
neural foramina appear patent.
3. Mild dextrocurvature of the thoracic spine centered at T7.
Otherwise unremarkable thoracic spine radiographs.
4. Mild degenerative changes at L4-L5 and L5-S1.

## 2023-04-15 IMAGING — MR MR CERVICAL SPINE WO/W CM
5 of 8 series · 19 of 48 positions shown · IV contrast (cc GAD)
Comparison: Cervical spine radiographs 11/24/2021

CLINICAL DATA: Acute myelopathy cervical spine.  Numbness in arms.

EXAM:
MRI CERVICAL SPINE WITHOUT AND WITH CONTRAST
TECHNIQUE: Multiplanar and multiecho pulse sequences of the cervical spine, to
include the craniocervical junction and cervicothoracic junction,
were obtained without and with intravenous contrast.
CONTRAST:  10mL GADAVIST GADOBUTROL 1 MMOL/ML IV SOLN

[Series 2: T2 · sagittal · 3.0mm · 0.43mm/px · 3 of 17 slices shown (1 of 2)]
[im 1/17]
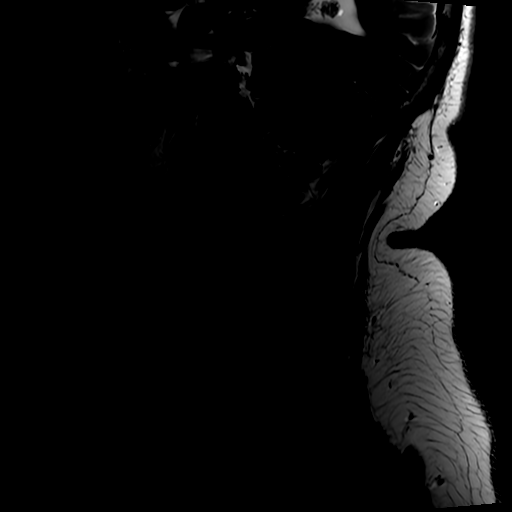
[im 9/17]
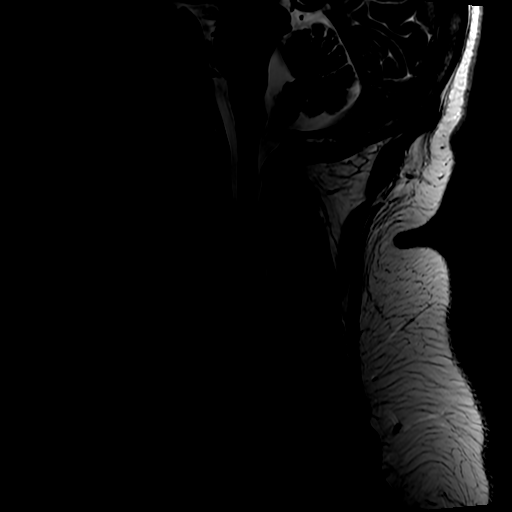
[im 17/17]
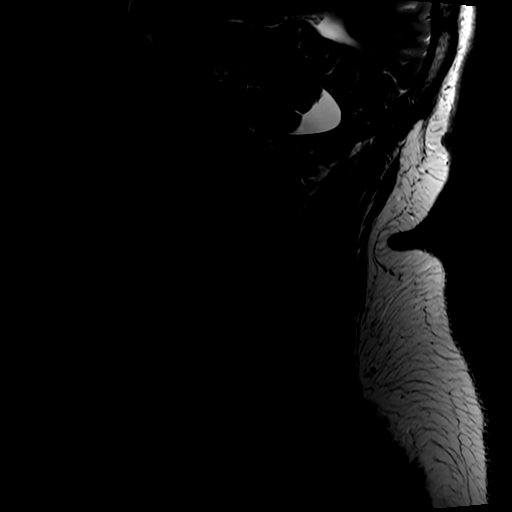

[Series 3: STIR · sagittal · 3.0mm · 0.43mm/px · 2 of 17 slices shown]
[im 1/17]
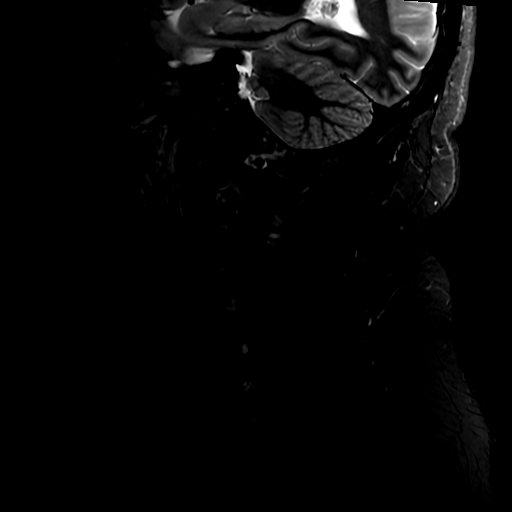
[im 9/17]
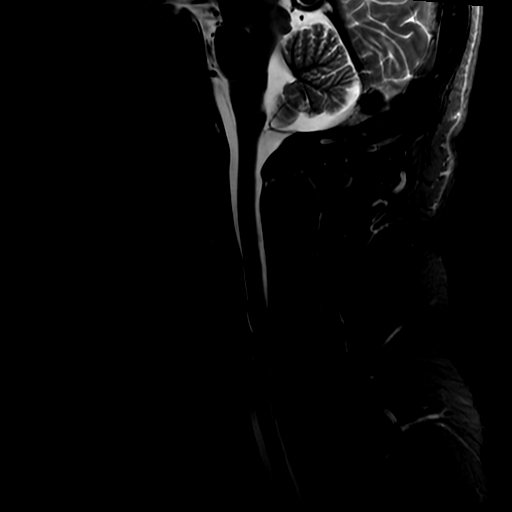

[Series 6: T2 · axial · 3.0mm · 0.35mm/px · z∈[-74,+23]mm · 5 of 24 slices shown (2 of 2)]
[im 1/24]
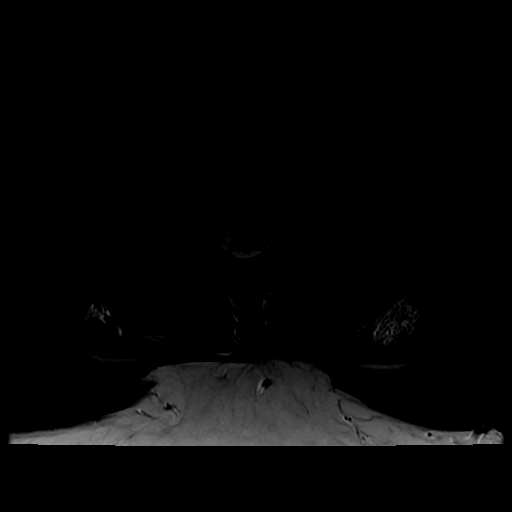
[im 6/24]
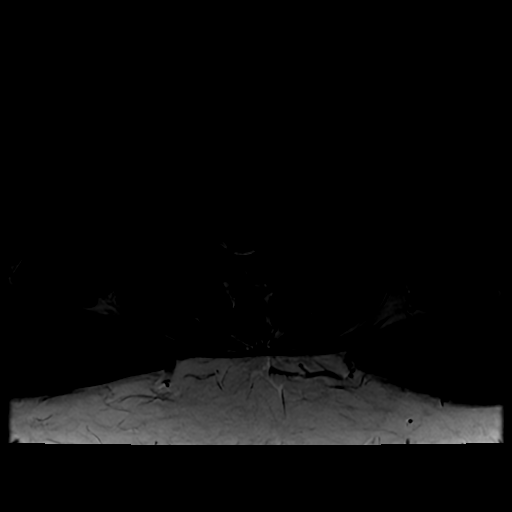
[im 12/24]
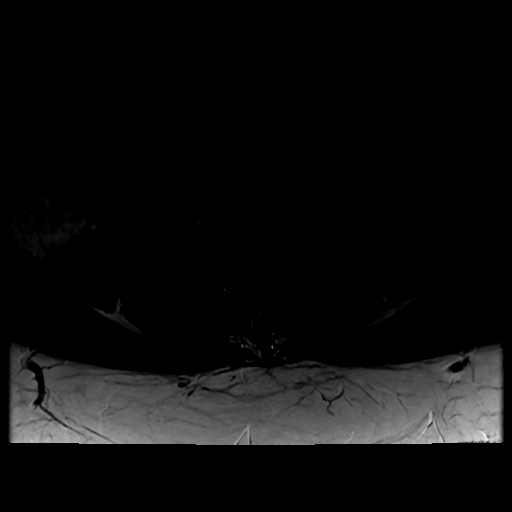
[im 18/24]
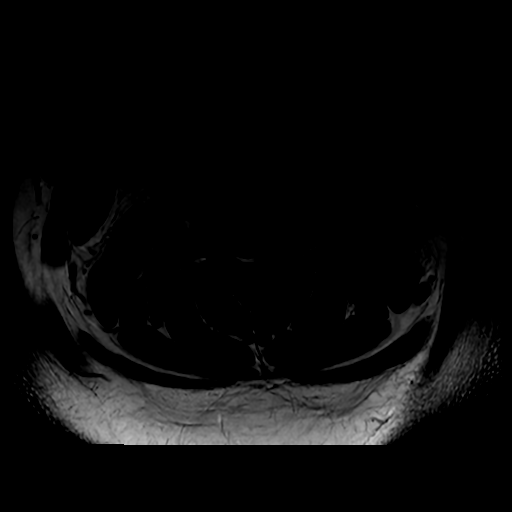
[im 24/24]
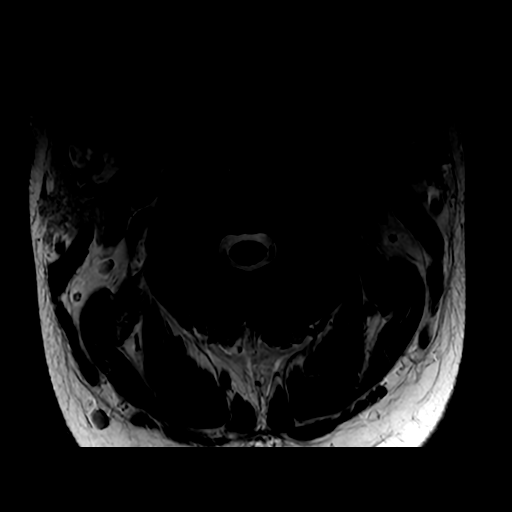

[Series 7: T1 · axial · non-contrast · 3.0mm · 0.35mm/px · z∈[-74,+19]mm · 5 of 25 slices shown]
[im 1/25]
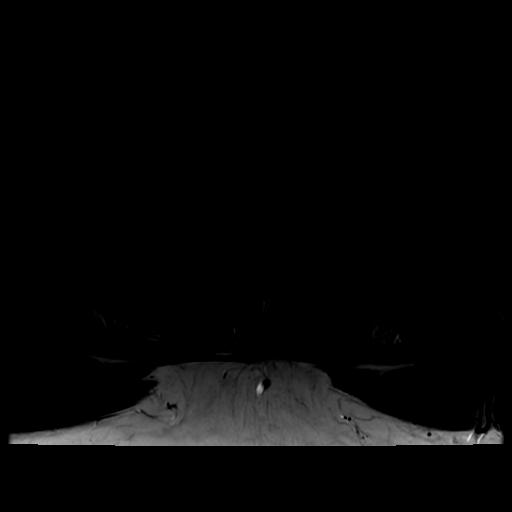
[im 7/25]
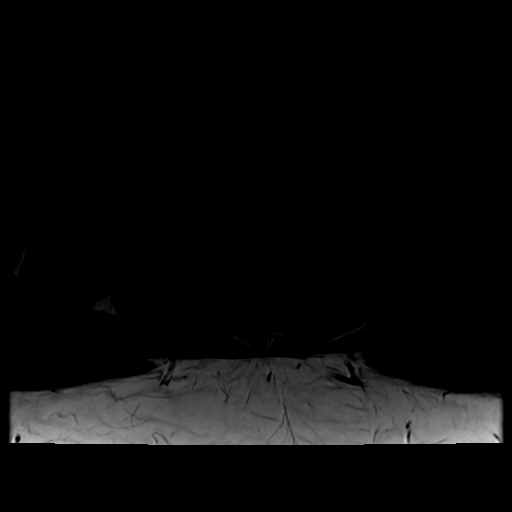
[im 13/25]
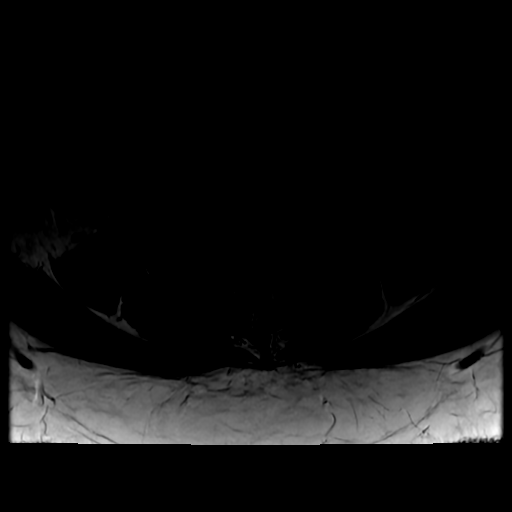
[im 19/25]
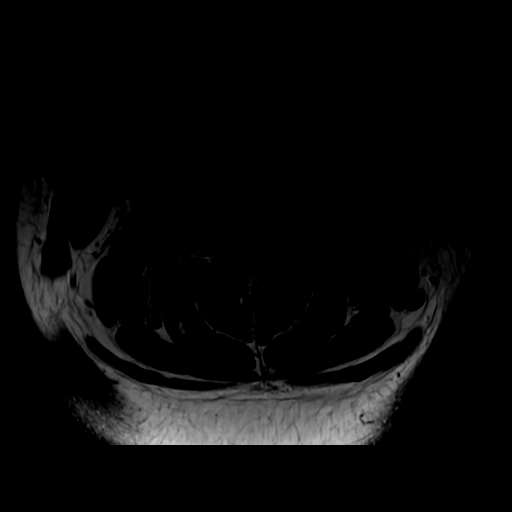
[im 25/25]
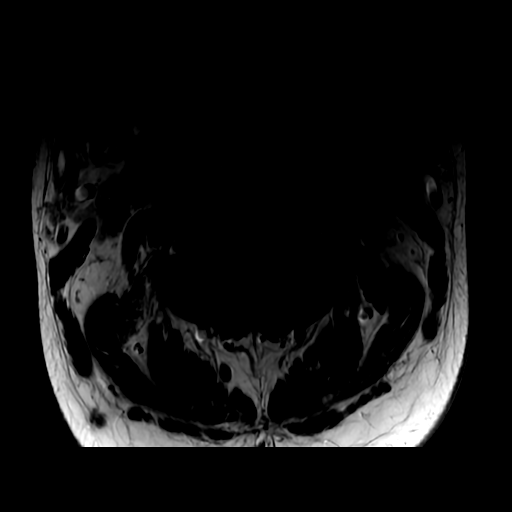

[Series 8: T1 fat-sat post-contrast · sagittal · 3.0mm · 0.43mm/px · 4 of 17 slices shown]
[im 1/17]
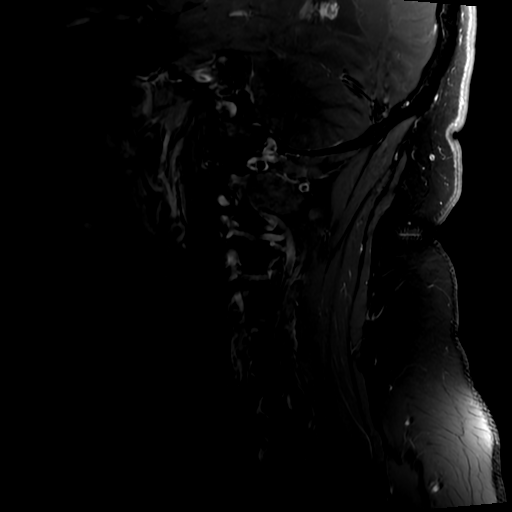
[im 6/17]
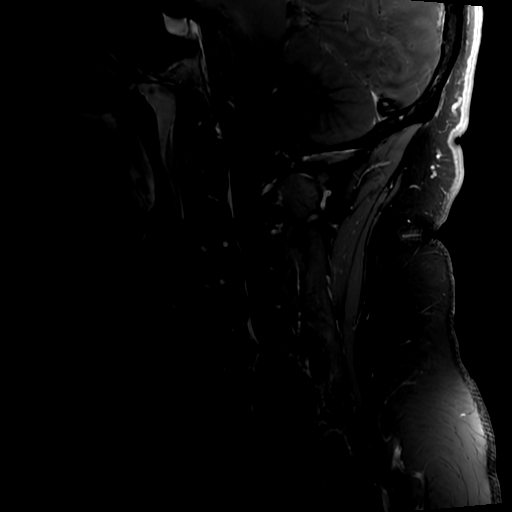
[im 11/17]
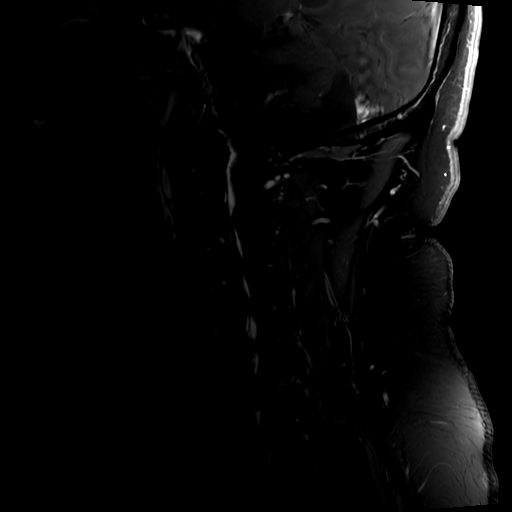
[im 17/17]
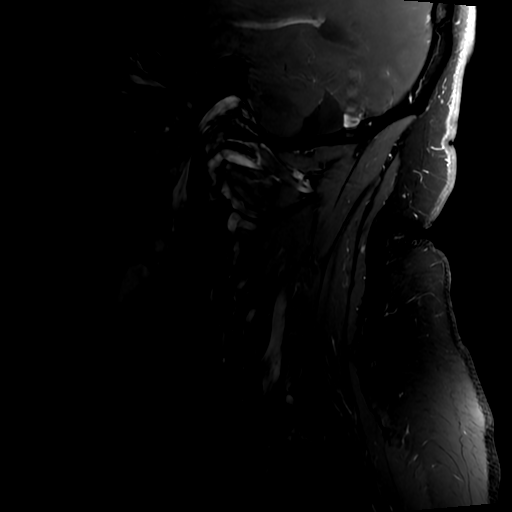

[19 of 48 positions shown; findings below may reference images not displayed]

FINDINGS: Alignment: Normal alignment. Straightening of the cervical lordosis.

Vertebrae: Negative for fracture or mass

Cord: Cord flattening on the left at C5-6 due to disc protrusion. No
cord signal abnormality identified.

Posterior Fossa, vertebral arteries, paraspinal tissues: Negative

Disc levels:

C2-3: Negative

C3-4: Minimal central disc protrusion.  Negative for stenosis

C4-5: Disc degeneration with mild uncinate spurring. No significant
stenosis.

C5-6: Extruded disc fragment on the left indenting the thecal sac.
This could be causing left C6 nerve root impingement. Cord
flattening on the left. Mild to moderate spinal stenosis. No cord
signal abnormality.

C6-7: Mild disc degeneration and spurring without stenosis

C7-T1: Negative
IMPRESSION: Extruded disc fragment on the left at C5-6 with cord flattening and
probable left C6 nerve root impingement. Mild to moderate spinal
stenosis.

## 2023-04-15 IMAGING — CR DG CERVICAL SPINE COMPLETE 4+V
7 series · 7 of 7 positions shown · non-contrast
Comparison: CT abdomen/pelvis 10/28/2020, lumbar spine MRI
10/06/2018

CLINICAL DATA: Lower back pain for 2 weeks, mid back pain in arms
since yesterday, history of degenerative disc disease

EXAM:
LUMBAR SPINE - COMPLETE 4+ VIEW; THORACIC SPINE 2 VIEWS; CERVICAL
SPINE - COMPLETE 4+ VIEW

[c-spine lat]
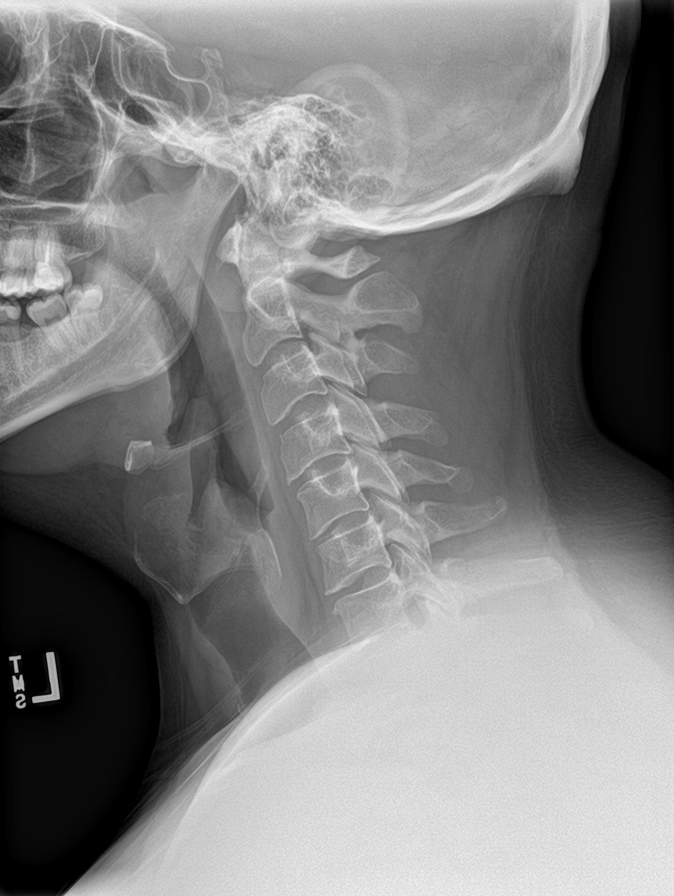

[c-spine obl (1 of 2)]
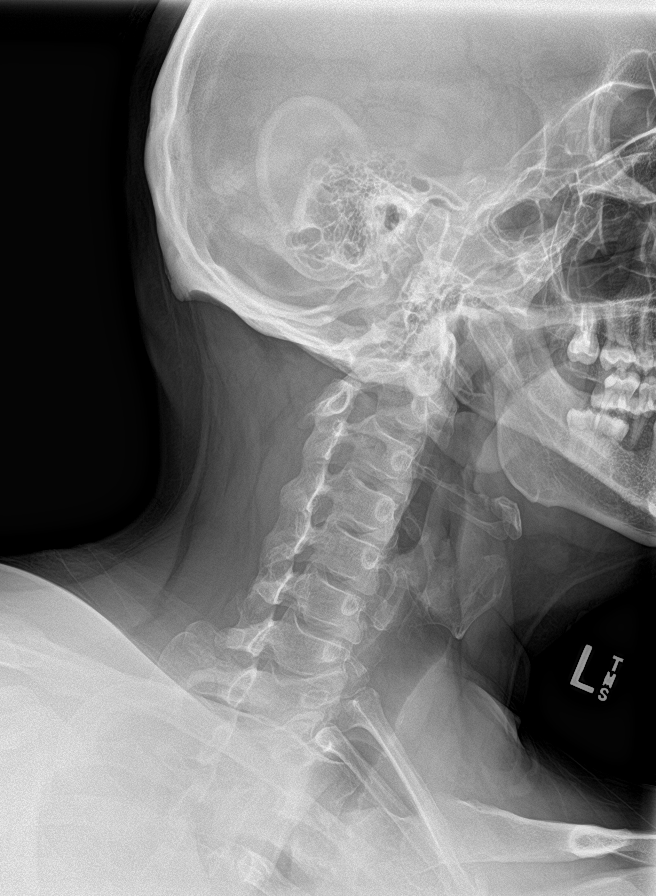

[c-spine obl (2 of 2)]
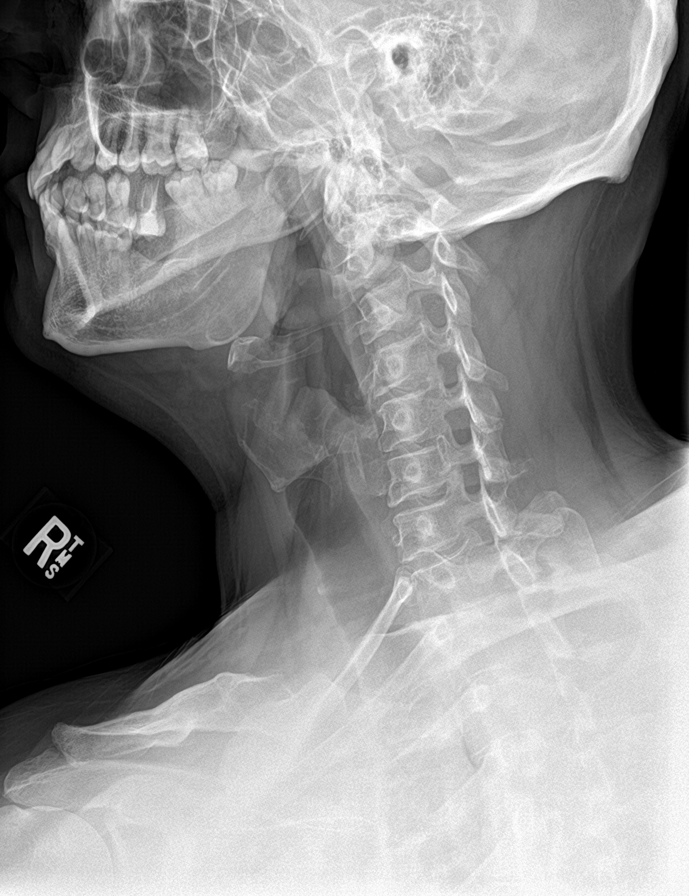

[c-spine ap]
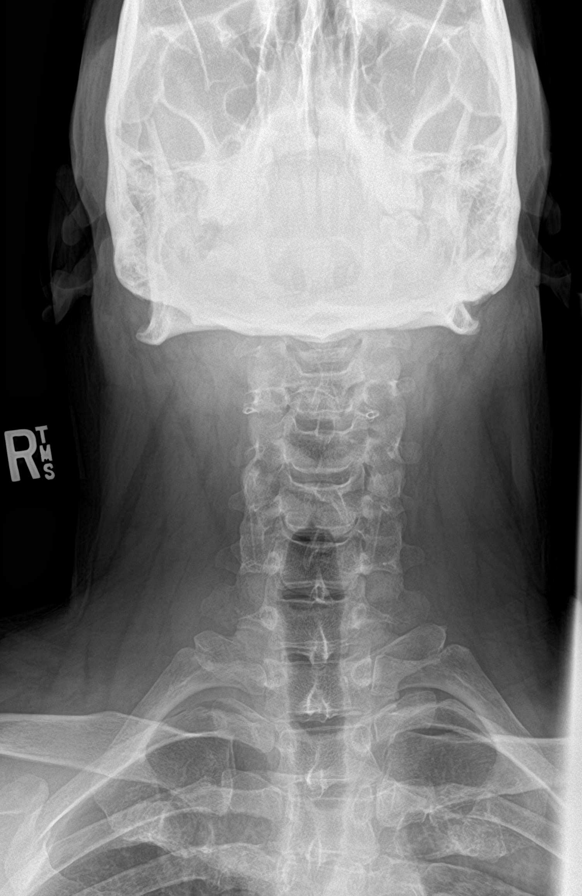

[c-spine open mouth (1 of 2)]
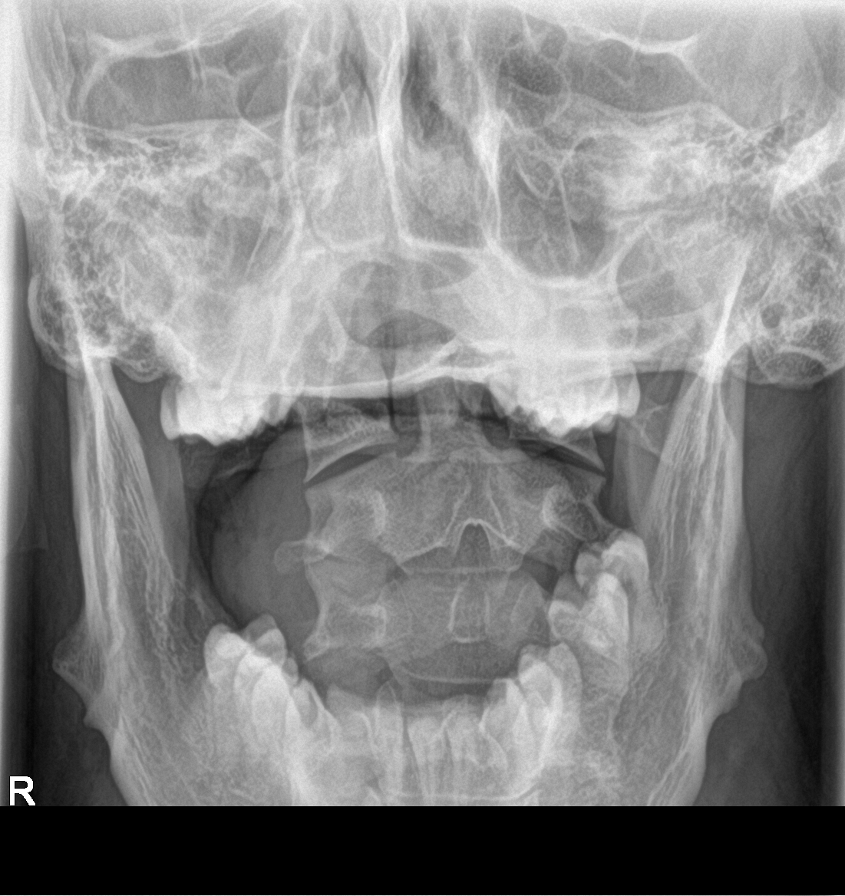

[c-spine swimmers]
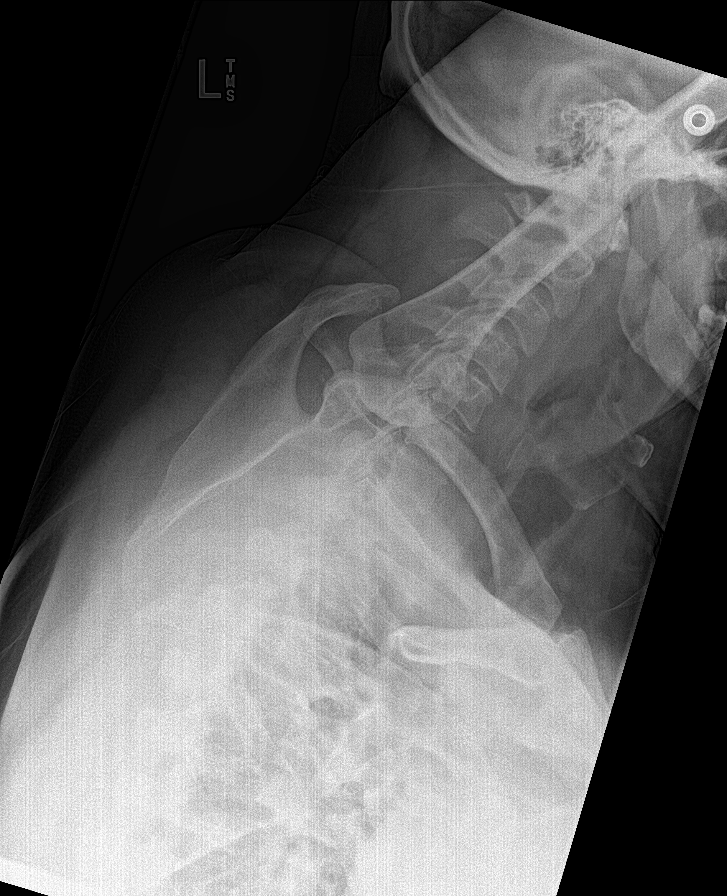

[c-spine open mouth (2 of 2)]
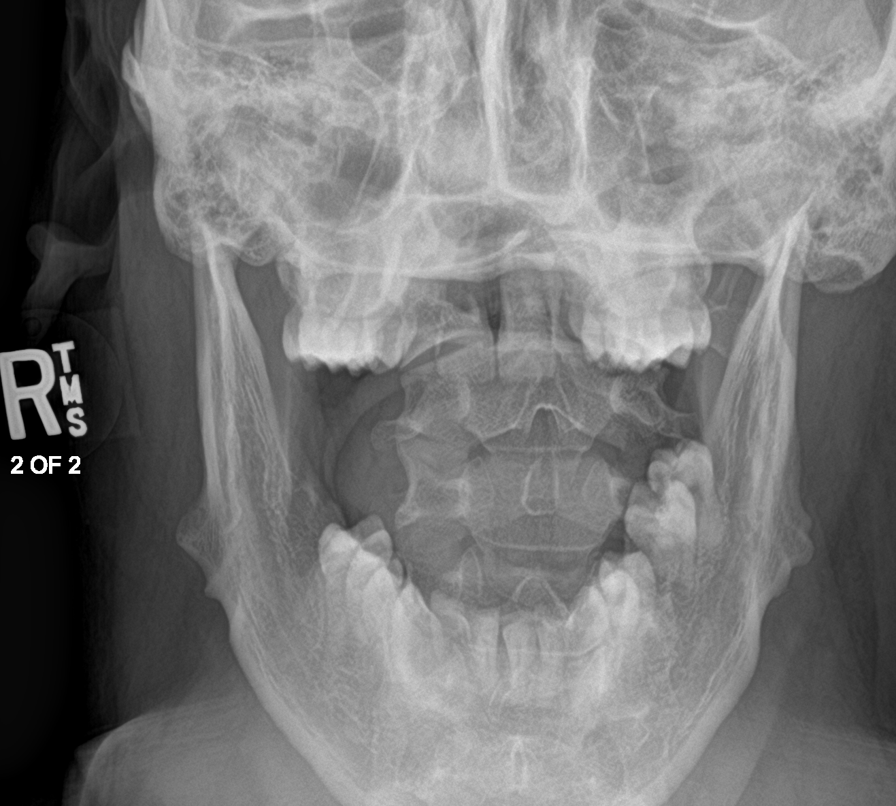

[7 of 7 positions shown; findings below may reference images not displayed]

FINDINGS: Cervical:

The cervical spine is imaged through the C7 vertebral body in the
lateral projection. There is straightening of the normal cervical
lordosis. There is no antero or retrolisthesis. Vertebral body
heights are preserved, without evidence of acute injury. The disc
heights are overall preserved, with minimal degenerative endplate
change at C4-C5 through C6-C7. The neural foramina appear patent.
The prevertebral soft tissues are unremarkable.

Thoracic:

The upper thoracic vertebral bodies are suboptimally assessed due to
overlying structures. The imaged thoracic vertebral body heights are
preserved, without evidence of acute injury. There is minimal
dextroscoliosis centered at T7. Sagittal alignment is normal, with
no antero or retrolisthesis. The disc heights are preserved. There
is no significant degenerative change.

The imaged heart and lungs unremarkable.

Lumbar:

There are 5 non-rib-bearing lumbar type vertebral bodies. Vertebral
body heights are preserved, without evidence of acute injury.
Alignment is normal. There is no evidence of spondylolysis. There is
mild disc space narrowing at L4-L5 and L5-S1 with associated
degenerative endplate change. There is mild facet arthropathy at
L5-S1.

The SI joints are intact.  Cholecystectomy clips are noted.
IMPRESSION: 1. No acute finding in the cervical, thoracic, or lumbar spine.
2. Minimal degenerative endplate change in the cervical spine. The
neural foramina appear patent.
3. Mild dextrocurvature of the thoracic spine centered at T7.
Otherwise unremarkable thoracic spine radiographs.
4. Mild degenerative changes at L4-L5 and L5-S1.

## 2023-04-15 IMAGING — CR DG THORACIC SPINE 2V
2 series · 2 of 2 positions shown · non-contrast
Comparison: CT abdomen/pelvis 10/28/2020, lumbar spine MRI
10/06/2018

CLINICAL DATA: Lower back pain for 2 weeks, mid back pain in arms
since yesterday, history of degenerative disc disease

EXAM:
LUMBAR SPINE - COMPLETE 4+ VIEW; THORACIC SPINE 2 VIEWS; CERVICAL
SPINE - COMPLETE 4+ VIEW

[t-spine ap]
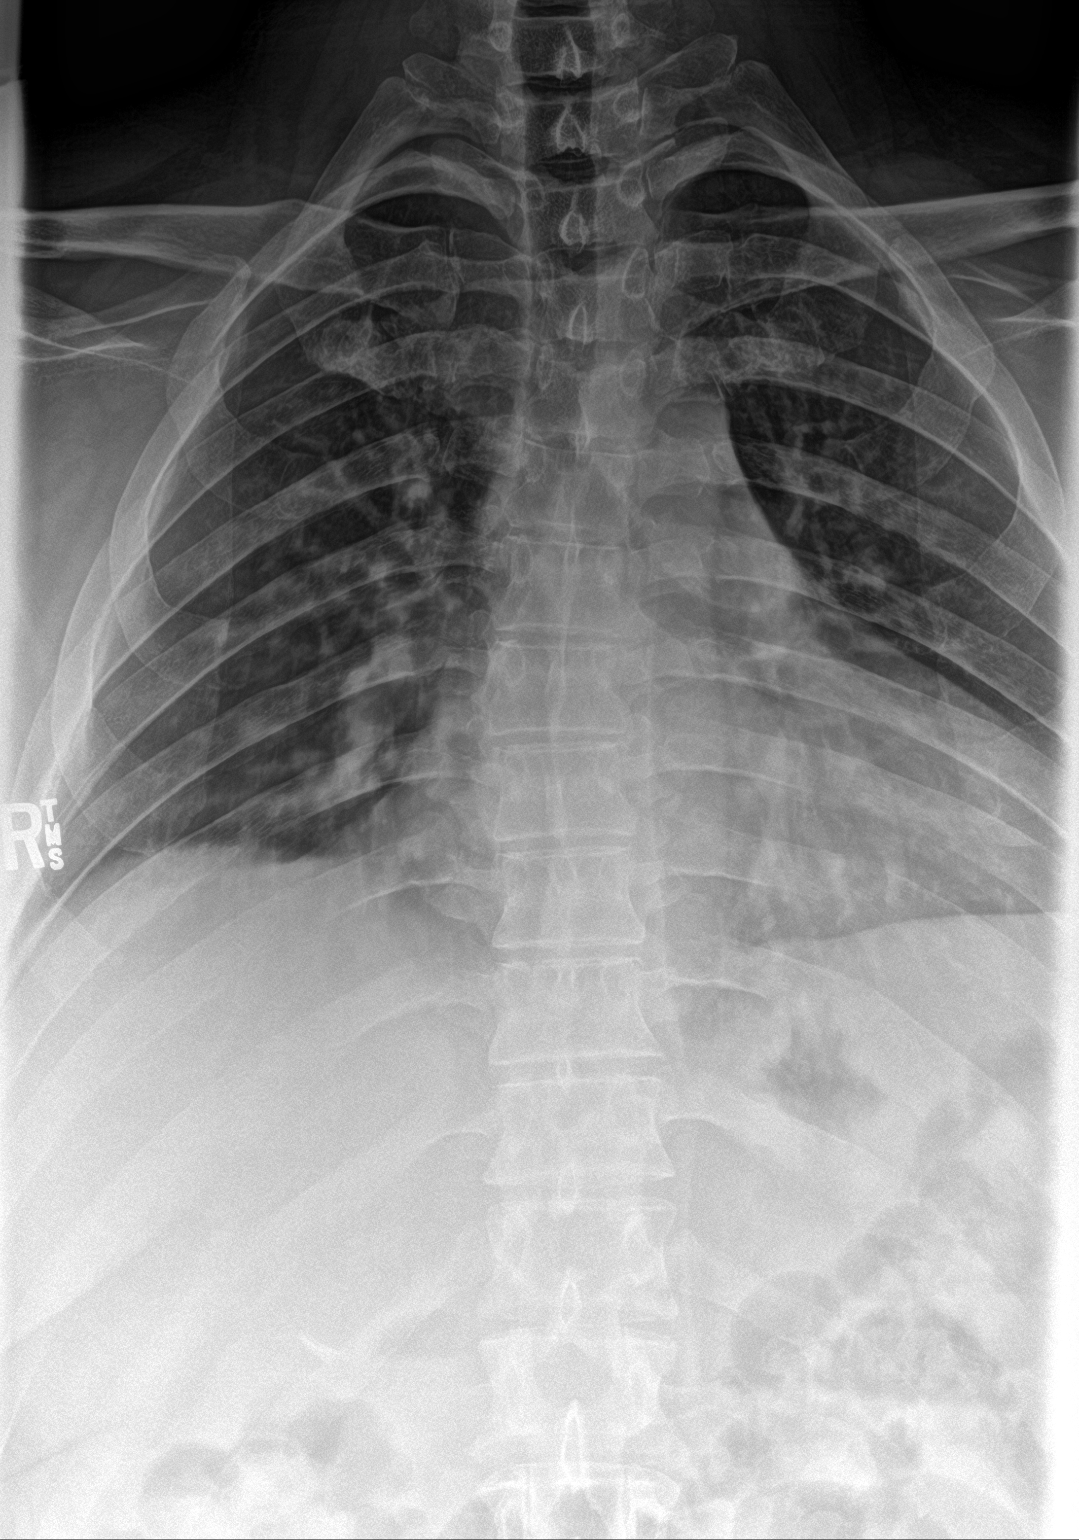

[t-spine lat]
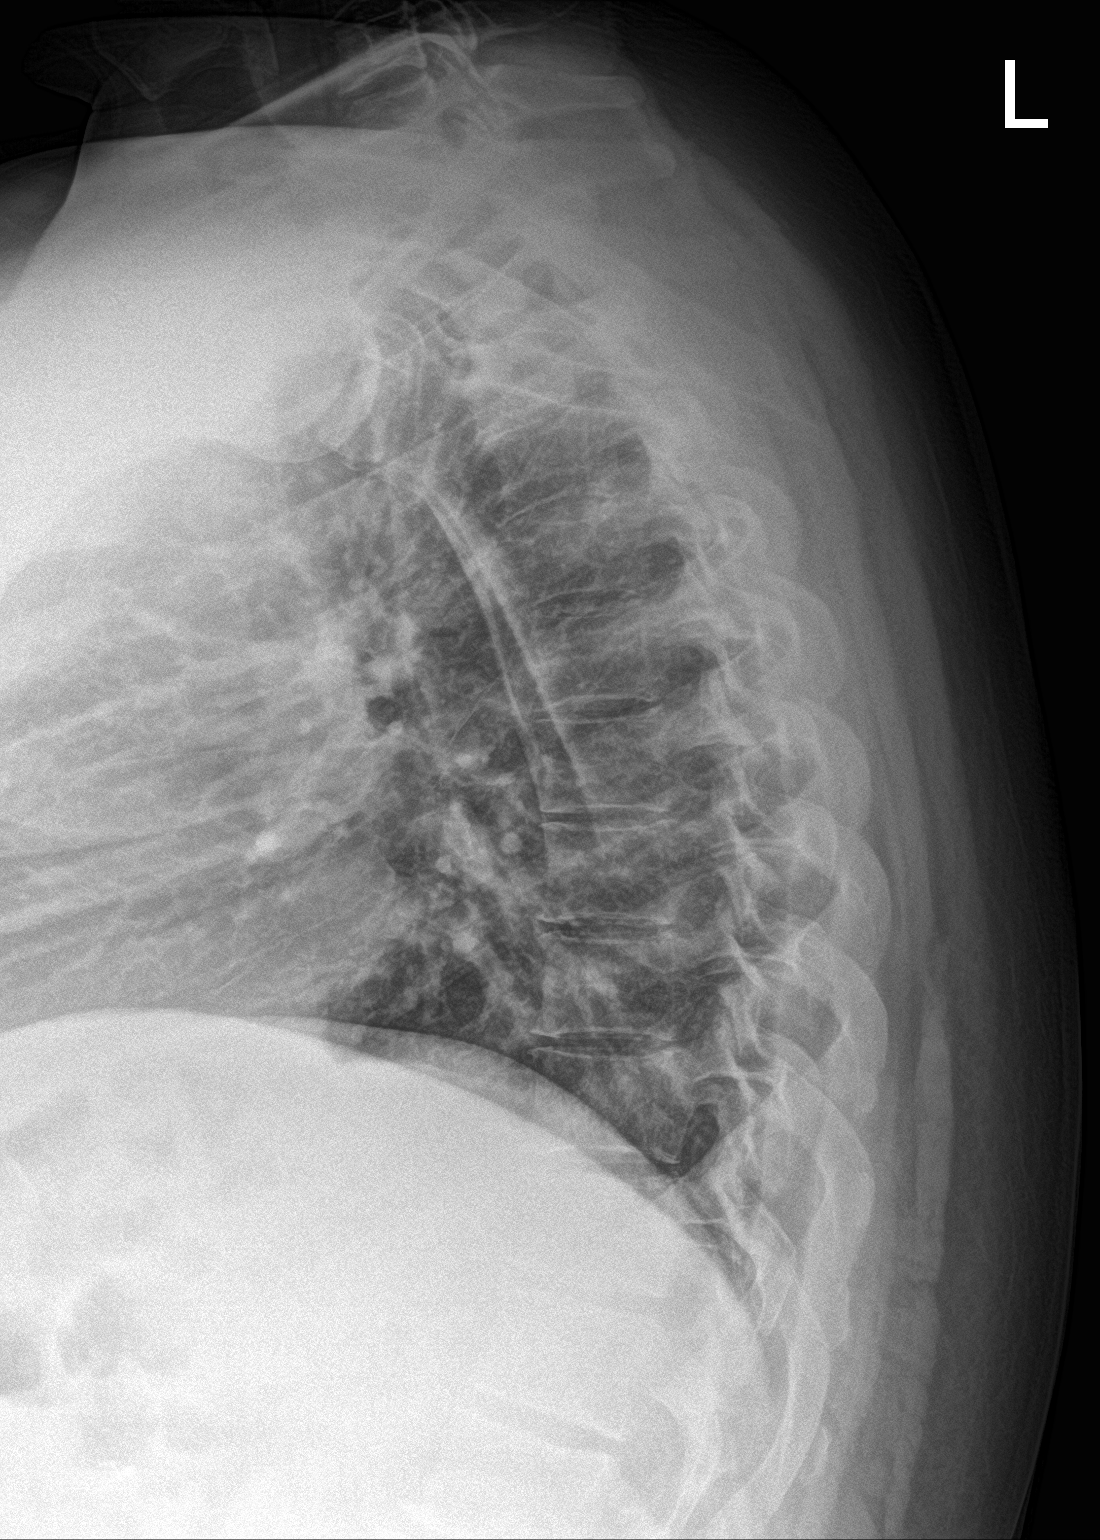

[2 of 2 positions shown; findings below may reference images not displayed]

FINDINGS: Cervical:

The cervical spine is imaged through the C7 vertebral body in the
lateral projection. There is straightening of the normal cervical
lordosis. There is no antero or retrolisthesis. Vertebral body
heights are preserved, without evidence of acute injury. The disc
heights are overall preserved, with minimal degenerative endplate
change at C4-C5 through C6-C7. The neural foramina appear patent.
The prevertebral soft tissues are unremarkable.

Thoracic:

The upper thoracic vertebral bodies are suboptimally assessed due to
overlying structures. The imaged thoracic vertebral body heights are
preserved, without evidence of acute injury. There is minimal
dextroscoliosis centered at T7. Sagittal alignment is normal, with
no antero or retrolisthesis. The disc heights are preserved. There
is no significant degenerative change.

The imaged heart and lungs unremarkable.

Lumbar:

There are 5 non-rib-bearing lumbar type vertebral bodies. Vertebral
body heights are preserved, without evidence of acute injury.
Alignment is normal. There is no evidence of spondylolysis. There is
mild disc space narrowing at L4-L5 and L5-S1 with associated
degenerative endplate change. There is mild facet arthropathy at
L5-S1.

The SI joints are intact.  Cholecystectomy clips are noted.
IMPRESSION: 1. No acute finding in the cervical, thoracic, or lumbar spine.
2. Minimal degenerative endplate change in the cervical spine. The
neural foramina appear patent.
3. Mild dextrocurvature of the thoracic spine centered at T7.
Otherwise unremarkable thoracic spine radiographs.
4. Mild degenerative changes at L4-L5 and L5-S1.

## 2023-05-16 ENCOUNTER — Other Ambulatory Visit: Payer: Self-pay

## 2023-05-16 DIAGNOSIS — N529 Male erectile dysfunction, unspecified: Secondary | ICD-10-CM

## 2023-05-16 MED ORDER — SILDENAFIL CITRATE 20 MG PO TABS
20.0000 mg | ORAL_TABLET | Freq: Every day | ORAL | 1 refills | Status: DC
Start: 2023-05-16 — End: 2023-11-27

## 2023-06-05 DIAGNOSIS — R109 Unspecified abdominal pain: Secondary | ICD-10-CM | POA: Diagnosis not present

## 2023-08-24 DIAGNOSIS — J069 Acute upper respiratory infection, unspecified: Secondary | ICD-10-CM | POA: Diagnosis not present

## 2023-08-30 ENCOUNTER — Ambulatory Visit: Payer: BC Managed Care – PPO | Admitting: Urology

## 2023-08-31 DIAGNOSIS — J069 Acute upper respiratory infection, unspecified: Secondary | ICD-10-CM | POA: Diagnosis not present

## 2023-08-31 DIAGNOSIS — R070 Pain in throat: Secondary | ICD-10-CM | POA: Diagnosis not present

## 2023-08-31 DIAGNOSIS — J019 Acute sinusitis, unspecified: Secondary | ICD-10-CM | POA: Diagnosis not present

## 2023-09-15 ENCOUNTER — Ambulatory Visit: Payer: BC Managed Care – PPO | Admitting: Urology

## 2023-09-15 ENCOUNTER — Other Ambulatory Visit: Payer: Self-pay | Admitting: Urology

## 2023-09-15 DIAGNOSIS — N200 Calculus of kidney: Secondary | ICD-10-CM

## 2023-09-15 NOTE — Progress Notes (Deleted)
 Assessment: 1. Microscopic hematuria; negative evaluation 7/24   2. Nephrolithiasis     Plan: KUB study from today reviewed. Continue stone prevention  Chief Complaint:  No chief complaint on file.   History of Present Illness:  Jack Peterson is a 43 y.o. male who is seen for further evaluation of right flank pain, history of nephrolithiasis, and microscopic hematuria.  He has a prior history of kidney stones, passing his first stone and undergoing shockwave lithotripsy for his second stone in 2016.  He noted intermittent right-sided flank pain approximately 3 months ago.  He has had occasional pain on the left side as well.  He reports the pain is mild.  He has not required any pain medication.  No fevers, chills, nausea, or vomiting.  No gross hematuria. CT abdomen and pelvis without contrast from 3/22 showed a 5 mm stone in the right kidney, no left nephrolithiasis. KUB from 12/26/2022 showed a 9 mm calcification in the lower pole of the right kidney. Urinalysis from 5/24 was positive for blood on dipstick, no micro. He reports mild lower urinary tract symptoms including frequency, urgency, nocturia x 2, and sensation of incomplete emptying. IPSS = 8. 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 Cystoscopy from July 2024 showed lateral lobe enlargement of the prostate but no bladder lesions.  He returns today for 40-month follow-up.  Portions of the above documentation were copied from a prior visit for review purposes only.  Past Medical History:  Past Medical History:  Diagnosis Date   Degenerative disc disease at L5-S1 level    GERD (gastroesophageal reflux disease)    Nephrolithiasis    Ureterolithiasis     Past Surgical History:  Past Surgical History:  Procedure Laterality Date   CERVICAL  DISCECTOMY  03/2022   CHOLECYSTECTOMY N/A 03/03/2021   Procedure: LAPAROSCOPIC CHOLECYSTECTOMY;  Surgeon: Fritzi Mandes, MD;  Location: [facility] OR;  Service: General;  Laterality: N/A;   ESOPHAGEAL DILATION     LITHOTRIPSY Left 2016   TONSILLECTOMY      Allergies:  Allergies  Allergen Reactions   Tuna Oil [Fish Oil] Itching and Rash    Family History:  Family History  Problem Relation Age of Onset   Hypertension Father    Cancer Father        prostate   Hypertension Maternal Grandmother    Cancer Paternal Grandfather        prostate   Cancer Paternal Great-grandfather        prostate   Diabetes Neg Hx    Stroke Neg Hx     Social History:  Social History   Tobacco Use   Smoking status: Former    Current packs/day: 0.00    Types: Cigarettes    Quit date: 2021    Years since quitting: 4.1   Smokeless tobacco: Never  Vaping Use   Vaping status: Never Used  Substance Use Topics   Alcohol use: Yes    Alcohol/week: 2.0 - 3.0 standard drinks of alcohol    Types: 2 - 3 Standard drinks or equivalent per week    Comment: social   Drug use: No    ROS: Constitutional:  Negative for fever, chills, weight loss CV: Negative for chest pain, previous MI, hypertension Respiratory:  Negative for shortness of breath, wheezing, sleep apnea, frequent cough GI:  Negative for nausea, vomiting, bloody stool, GERD  Physical exam: There were no vitals taken for this visit. GENERAL APPEARANCE:  Well appearing, well developed, well nourished, NAD HEENT:  Atraumatic, normocephalic, oropharynx clear NECK:  Supple without lymphadenopathy or thyromegaly ABDOMEN:  Soft, non-tender, no masses EXTREMITIES:  Moves all extremities well, without clubbing, cyanosis, or edema NEUROLOGIC:  Alert and oriented x 3, normal gait, CN II-XII grossly intact MENTAL STATUS:  appropriate BACK:  Non-tender to palpation, No CVAT SKIN:  Warm, dry, and intact  Results: U/A:

## 2023-09-28 DIAGNOSIS — M5412 Radiculopathy, cervical region: Secondary | ICD-10-CM | POA: Diagnosis not present

## 2023-09-28 DIAGNOSIS — Z6841 Body Mass Index (BMI) 40.0 and over, adult: Secondary | ICD-10-CM | POA: Diagnosis not present

## 2023-09-28 DIAGNOSIS — M542 Cervicalgia: Secondary | ICD-10-CM | POA: Diagnosis not present

## 2023-10-05 ENCOUNTER — Ambulatory Visit: Payer: BC Managed Care – PPO | Admitting: Urology

## 2023-10-17 ENCOUNTER — Ambulatory Visit (INDEPENDENT_AMBULATORY_CARE_PROVIDER_SITE_OTHER): Payer: BC Managed Care – PPO | Admitting: Family Medicine

## 2023-10-17 ENCOUNTER — Encounter: Payer: Self-pay | Admitting: Family Medicine

## 2023-10-17 VITALS — BP 122/80 | HR 84 | Temp 98.4°F | Ht 71.0 in | Wt 338.4 lb

## 2023-10-17 DIAGNOSIS — Z23 Encounter for immunization: Secondary | ICD-10-CM | POA: Diagnosis not present

## 2023-10-17 DIAGNOSIS — Z6841 Body Mass Index (BMI) 40.0 and over, adult: Secondary | ICD-10-CM

## 2023-10-17 DIAGNOSIS — Z0000 Encounter for general adult medical examination without abnormal findings: Secondary | ICD-10-CM | POA: Diagnosis not present

## 2023-10-17 LAB — CBC WITH DIFFERENTIAL/PLATELET
Basophils Absolute: 0 10*3/uL (ref 0.0–0.1)
Basophils Relative: 0.8 % (ref 0.0–3.0)
Eosinophils Absolute: 0.2 10*3/uL (ref 0.0–0.7)
Eosinophils Relative: 3.4 % (ref 0.0–5.0)
HCT: 46.7 % (ref 39.0–52.0)
Hemoglobin: 15.8 g/dL (ref 13.0–17.0)
Lymphocytes Relative: 26.7 % (ref 12.0–46.0)
Lymphs Abs: 1.5 10*3/uL (ref 0.7–4.0)
MCHC: 33.9 g/dL (ref 30.0–36.0)
MCV: 89.4 fl (ref 78.0–100.0)
Monocytes Absolute: 0.5 10*3/uL (ref 0.1–1.0)
Monocytes Relative: 8.5 % (ref 3.0–12.0)
Neutro Abs: 3.3 10*3/uL (ref 1.4–7.7)
Neutrophils Relative %: 60.6 % (ref 43.0–77.0)
Platelets: 224 10*3/uL (ref 150.0–400.0)
RBC: 5.22 Mil/uL (ref 4.22–5.81)
RDW: 12.5 % (ref 11.5–15.5)
WBC: 5.5 10*3/uL (ref 4.0–10.5)

## 2023-10-17 LAB — BASIC METABOLIC PANEL
BUN: 13 mg/dL (ref 6–23)
CO2: 31 meq/L (ref 19–32)
Calcium: 9.4 mg/dL (ref 8.4–10.5)
Chloride: 101 meq/L (ref 96–112)
Creatinine, Ser: 0.8 mg/dL (ref 0.40–1.50)
GFR: 107.71 mL/min (ref >60.00)
Glucose, Bld: 93 mg/dL (ref 70–99)
Potassium: 4.3 meq/L (ref 3.5–5.1)
Sodium: 138 meq/L (ref 135–145)

## 2023-10-17 LAB — LIPID PANEL
Cholesterol: 136 mg/dL (ref 0–200)
HDL: 55.6 mg/dL (ref >39.00)
LDL Cholesterol: 68 mg/dL (ref 0–99)
NonHDL: 80.5
Total CHOL/HDL Ratio: 2
Triglycerides: 61 mg/dL (ref 0.0–149.0)
VLDL: 12.2 mg/dL (ref 0.0–40.0)

## 2023-10-17 LAB — HEPATIC FUNCTION PANEL
ALT: 30 U/L (ref 0–53)
AST: 22 U/L (ref 0–37)
Albumin: 4.4 g/dL (ref 3.5–5.2)
Alkaline Phosphatase: 62 U/L (ref 39–117)
Bilirubin, Direct: 0.2 mg/dL (ref 0.0–0.3)
Total Bilirubin: 0.8 mg/dL (ref 0.2–1.2)
Total Protein: 7.1 g/dL (ref 6.0–8.3)

## 2023-10-17 LAB — TSH: TSH: 1.63 u[IU]/mL (ref 0.35–5.50)

## 2023-10-17 LAB — VITAMIN D 25 HYDROXY (VIT D DEFICIENCY, FRACTURES): VITD: 18.66 ng/mL — ABNORMAL LOW (ref 30.00–100.00)

## 2023-10-17 LAB — HEMOGLOBIN A1C: Hgb A1c MFr Bld: 5.5 % (ref 4.6–6.5)

## 2023-10-17 NOTE — Patient Instructions (Addendum)
Follow up in 1 year or as needed We'll notify you of your lab results and make any changes if needed Continue to work on healthy diet and regular exercise- you can do it! Call with any questions or concerns Stay Safe!  Stay Healthy! Happy Spring!!! 

## 2023-10-17 NOTE — Progress Notes (Signed)
   Subjective:    Patient ID: Jack Peterson, male    DOB: 10/15/1980, 43 y.o.   MRN: [id number]  HPI CPE- due for Tdap.  No concerns today.  Patient Care Team    Relationship Specialty Notifications Start End  Sheliah Hatch, MD PCP - General   07/14/10     Health Maintenance  Topic Date Due   DTaP/Tdap/Td (2 - Td or Tdap) 08/23/2022   INFLUENZA VACCINE  03/02/2023   COVID-19 Vaccine (3 - 2024-25 season) 04/02/2023   Hepatitis C Screening  Completed   HIV Screening  Completed   HPV VACCINES  Aged Out      Review of Systems Patient reports no vision/hearing changes, anorexia, fever ,adenopathy, persistant/recurrent hoarseness, swallowing issues, chest pain, palpitations, edema, persistant/recurrent cough, hemoptysis, dyspnea (rest,exertional, paroxysmal nocturnal), gastrointestinal  bleeding (melena, rectal bleeding), abdominal pain, excessive heart burn, GU symptoms (dysuria, hematuria, voiding/incontinence issues) syncope, focal weakness, memory loss, numbness & tingling, skin/hair/nail changes, depression, anxiety, abnormal bruising/bleeding, musculoskeletal symptoms/signs.   + 18 lb weight gain    Objective:   Physical Exam General Appearance:    Alert, cooperative, no distress, appears stated age, obese  Head:    Normocephalic, without obvious abnormality, atraumatic  Eyes:    PERRL, conjunctiva/corneas clear, EOM's intact both eyes       Ears:    Normal TM's and external ear canals, both ears  Nose:   Nares normal, septum midline, mucosa normal, no drainage   or sinus tenderness  Throat:   Lips, mucosa, and tongue normal; teeth and gums normal  Neck:   Supple, symmetrical, trachea midline, no adenopathy;       thyroid:  No enlargement/tenderness/nodules  Back:     Symmetric, no curvature, ROM normal, no CVA tenderness  Lungs:     Clear to auscultation bilaterally, respirations unlabored  Chest wall:    No tenderness or deformity  Heart:    Regular rate and  rhythm, S1 and S2 normal, no murmur, rub   or gallop  Abdomen:     Soft, non-tender, bowel sounds active all four quadrants,    no masses, no organomegaly  Genitalia:    deferred  Rectal:    Extremities:   Extremities normal, atraumatic, no cyanosis or edema  Pulses:   2+ and symmetric all extremities  Skin:   Skin color, texture, turgor normal, no rashes or lesions  Lymph nodes:   Cervical, supraclavicular, and axillary nodes normal  Neurologic:   CNII-XII intact. Normal strength, sensation and reflexes      throughout          Assessment & Plan:

## 2023-10-17 NOTE — Assessment & Plan Note (Signed)
 Deteriorated.  Pt has gained 18 lbs since last visit.  Encouraged low carb diet and regular exercise.  Check labs to risk stratify.  Will follow.

## 2023-10-17 NOTE — Assessment & Plan Note (Signed)
Pt's PE WNL w/ exception of BMI.  Tdap given.  Check labs.  Anticipatory guidance provided.

## 2023-10-18 ENCOUNTER — Encounter: Payer: Self-pay | Admitting: Family Medicine

## 2023-10-18 DIAGNOSIS — E559 Vitamin D deficiency, unspecified: Secondary | ICD-10-CM

## 2023-10-18 MED ORDER — VITAMIN D (ERGOCALCIFEROL) 1.25 MG (50000 UNIT) PO CAPS
50000.0000 [IU] | ORAL_CAPSULE | ORAL | 0 refills | Status: AC
Start: 2023-10-18 — End: ?

## 2023-10-18 NOTE — Telephone Encounter (Signed)
 Copied from CRM [id number]. Topic: Clinical - Lab/Test Results >> Oct 18, 2023  1:49 PM Gurney Maxin H wrote: Reason for CRM: Patient called to obtain lab results from message left by office, agent gave patient message from provider as stated, patient acknowledged. Agent also advised patient that prescription for Vitamin D was sent to pharmacy on file and patient acknowledged.

## 2023-10-18 NOTE — Telephone Encounter (Signed)
-----   Message from Neena Rhymes sent at 10/18/2023 12:55 PM EDT ----- Labs look great w/ exception of low Vit D.  Based on this, we need to start 50,000 units weekly x12 weeks in addition to daily OTC supplement of at least 2000 units.

## 2023-10-18 NOTE — Telephone Encounter (Signed)
 Called patient to go over lab results, Left VM to return call    Did place the vitamin D order to pharmacy.

## 2023-10-25 ENCOUNTER — Ambulatory Visit: Admitting: Urology

## 2023-10-25 ENCOUNTER — Ambulatory Visit (HOSPITAL_BASED_OUTPATIENT_CLINIC_OR_DEPARTMENT_OTHER)
Admission: RE | Admit: 2023-10-25 | Discharge: 2023-10-25 | Disposition: A | Discharge status: Home / Self Care | Source: Ambulatory Visit | Attending: Urology | Admitting: Urology

## 2023-10-25 ENCOUNTER — Encounter: Payer: Self-pay | Admitting: Urology

## 2023-10-25 VITALS — BP 135/94 | HR 70 | Ht 71.0 in | Wt 330.0 lb

## 2023-10-25 DIAGNOSIS — Z87898 Personal history of other specified conditions: Secondary | ICD-10-CM | POA: Diagnosis not present

## 2023-10-25 DIAGNOSIS — R93421 Abnormal radiologic findings on diagnostic imaging of right kidney: Secondary | ICD-10-CM | POA: Diagnosis not present

## 2023-10-25 DIAGNOSIS — N200 Calculus of kidney: Secondary | ICD-10-CM | POA: Diagnosis not present

## 2023-10-25 DIAGNOSIS — R319 Hematuria, unspecified: Secondary | ICD-10-CM | POA: Diagnosis not present

## 2023-10-25 DIAGNOSIS — N289 Disorder of kidney and ureter, unspecified: Secondary | ICD-10-CM | POA: Diagnosis not present

## 2023-10-25 DIAGNOSIS — R3129 Other microscopic hematuria: Secondary | ICD-10-CM | POA: Diagnosis not present

## 2023-10-25 LAB — URINALYSIS, ROUTINE W REFLEX MICROSCOPIC
Bilirubin, UA: NEGATIVE
Glucose, UA: NEGATIVE
Ketones, UA: NEGATIVE
Leukocytes,UA: NEGATIVE
Nitrite, UA: NEGATIVE
Specific Gravity, UA: 1.025 (ref 1.005–1.030)
Urobilinogen, Ur: 0.2 mg/dL (ref 0.2–1.0)
pH, UA: 6 (ref 5.0–7.5)

## 2023-10-25 LAB — MICROSCOPIC EXAMINATION

## 2023-10-25 NOTE — Progress Notes (Signed)
 Assessment: 1. Nephrolithiasis   2. Microscopic hematuria; negative evaluation 7/24     Plan: I personally reviewed the KUB study from today.  There is a 5 x 10 mm calcification seen in the lower right renal shadow consistent with the known right renal calculus. Continue stone prevention. Hematuria profile sent today.  Will contact him with results. Return to office in 1 year with KUB.  Chief Complaint:  Chief Complaint  Patient presents with   Nephrolithiasis    History of Present Illness:  Jack Peterson is a 43 y.o. male who is seen for further evaluation of right flank pain, history of nephrolithiasis, and microscopic hematuria.  He has a prior history of kidney stones, passing his first stone and undergoing shockwave lithotripsy for his second stone in 2016.  He noted intermittent right-sided flank pain approximately 3 months ago.  He has had occasional pain on the left side as well.  He reports the pain is mild.  He has not required any pain medication.  No fevers, chills, nausea, or vomiting.  No gross hematuria. CT abdomen and pelvis without contrast from 3/22 showed a 5 mm stone in the right kidney, no left nephrolithiasis. KUB from 12/26/2022 showed a 9 mm calcification in the lower pole of the right kidney. Urinalysis from 5/24 was positive for blood on dipstick, no micro. He reports mild lower urinary tract symptoms including frequency, urgency, nocturia x 2, and sensation of incomplete emptying. IPSS = 8. 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 He was evaluated with cystoscopy in July 2024.  This showed no urethral or bladder abnormalities.  He returns today for follow-up.  He has not had any flank pain.  No dysuria or gross hematuria.  He does report nocturia x 2 and postvoid dribbling. IPSS =  8.  Portions of the above documentation were copied from a prior visit for review purposes only.  Past Medical History:  Past Medical History:  Diagnosis Date   Degenerative disc disease at L5-S1 level    GERD (gastroesophageal reflux disease)    Nephrolithiasis    Ureterolithiasis     Past Surgical History:  Past Surgical History:  Procedure Laterality Date   CERVICAL DISCECTOMY  03/2022   CHOLECYSTECTOMY N/A 03/03/2021   Procedure: LAPAROSCOPIC CHOLECYSTECTOMY;  Surgeon: Fritzi Mandes, MD;  Location: [facility] OR;  Service: General;  Laterality: N/A;   ESOPHAGEAL DILATION     LITHOTRIPSY Left 2016   TONSILLECTOMY      Allergies:  Allergies  Allergen Reactions   Tuna Oil [Fish Oil] Itching and Rash    Family History:  Family History  Problem Relation Age of Onset   Hypertension Father    Cancer Father        prostate   Hypertension Maternal Grandmother    Cancer Paternal Grandfather        prostate   Cancer Paternal Great-grandfather        prostate   Diabetes Neg Hx    Stroke Neg Hx     Social History:  Social History   Tobacco Use   Smoking status: Former    Current packs/day: 0.00    Types: Cigarettes    Quit date: 2021    Years since quitting: 4.2   Smokeless tobacco: Never  Vaping Use   Vaping status: Never Used  Substance Use Topics   Alcohol use: Yes    Alcohol/week: 2.0 - 3.0 standard drinks of alcohol    Types: 2 - 3 Standard drinks or equivalent per week    Comment: social   Drug use: No    ROS: Constitutional:  Negative for fever, chills, weight loss CV: Negative for chest pain, previous MI, hypertension Respiratory:  Negative for shortness of breath, wheezing, sleep apnea, frequent cough GI:  Negative for nausea, vomiting, bloody stool, GERD  Physical exam: BP (!) 135/94   Pulse 70   Ht 5\' 11"  (1.803 m)   Wt (!) 330 lb (149.7 kg)   BMI 46.03 kg/m  GENERAL APPEARANCE:  Well appearing, well developed, well nourished, NAD HEENT:   Atraumatic, normocephalic, oropharynx clear NECK:  Supple without lymphadenopathy or thyromegaly ABDOMEN:  Soft, non-tender, no masses EXTREMITIES:  Moves all extremities well, without clubbing, cyanosis, or edema NEUROLOGIC:  Alert and oriented x 3, normal gait, CN II-XII grossly intact MENTAL STATUS:  appropriate BACK:  Non-tender to palpation, No CVAT SKIN:  Warm, dry, and intact  Results: U/A: 0-5 WBCs, 3-10 RBCs

## 2023-10-30 ENCOUNTER — Encounter: Payer: Self-pay | Admitting: Urology

## 2023-10-31 ENCOUNTER — Encounter: Payer: Self-pay | Admitting: Urology

## 2023-11-27 ENCOUNTER — Other Ambulatory Visit: Payer: Self-pay

## 2023-11-27 DIAGNOSIS — J309 Allergic rhinitis, unspecified: Secondary | ICD-10-CM | POA: Diagnosis not present

## 2023-11-27 DIAGNOSIS — N529 Male erectile dysfunction, unspecified: Secondary | ICD-10-CM

## 2023-11-27 MED ORDER — SILDENAFIL CITRATE 20 MG PO TABS: 20.0000 mg | ORAL_TABLET | Freq: Every day | ORAL | 1 refills | Status: AC

## 2023-11-28 ENCOUNTER — Emergency Department (HOSPITAL_BASED_OUTPATIENT_CLINIC_OR_DEPARTMENT_OTHER)
Admission: EM | Admit: 2023-11-28 | Discharge: 2023-11-28 | Disposition: A | Discharge status: Home / Self Care | Attending: Emergency Medicine | Admitting: Emergency Medicine

## 2023-11-28 ENCOUNTER — Emergency Department (HOSPITAL_BASED_OUTPATIENT_CLINIC_OR_DEPARTMENT_OTHER)

## 2023-11-28 ENCOUNTER — Encounter (HOSPITAL_BASED_OUTPATIENT_CLINIC_OR_DEPARTMENT_OTHER): Payer: Self-pay | Admitting: *Deleted

## 2023-11-28 ENCOUNTER — Other Ambulatory Visit: Payer: Self-pay

## 2023-11-28 DIAGNOSIS — W540XXA Bitten by dog, initial encounter: Secondary | ICD-10-CM | POA: Insufficient documentation

## 2023-11-28 DIAGNOSIS — S61452A Open bite of left hand, initial encounter: Secondary | ICD-10-CM | POA: Diagnosis not present

## 2023-11-28 MED ORDER — AMOXICILLIN-POT CLAVULANATE 875-125 MG PO TABS
1.0000 | ORAL_TABLET | Freq: Once | ORAL | Status: AC
  Administered 2023-11-28: 1 via ORAL
  Filled 2023-11-28: qty 1

## 2023-11-28 MED ORDER — OXYCODONE-ACETAMINOPHEN 5-325 MG PO TABS: 1.0000 | ORAL_TABLET | Freq: Four times a day (QID) | ORAL | 0 refills | Status: AC | PRN

## 2023-11-28 MED ORDER — OXYCODONE-ACETAMINOPHEN 5-325 MG PO TABS
1.0000 | ORAL_TABLET | Freq: Once | ORAL | Status: AC
  Administered 2023-11-28: 1 via ORAL
  Filled 2023-11-28: qty 1

## 2023-11-28 MED ORDER — AMOXICILLIN-POT CLAVULANATE 875-125 MG PO TABS: 1.0000 | ORAL_TABLET | Freq: Two times a day (BID) | ORAL | 0 refills | Status: AC

## 2023-11-28 NOTE — Discharge Instructions (Signed)
 As discussed, take Augmentin twice a day for next days.  Have sent a prescription of Percocet to your pharmacy.  You can take this every 6 hours as needed for pain.  For any kind of swelling take ibuprofen 600 mg every 6-8 hours.  Follow-up with your primary care doctor in the next week for reevaluation.  Get help right away if: You have a red streak going away from your wound. You have any of these coming from your wound: Non-clear fluid. More blood. Pus or a bad smell. You have trouble moving your injured area. You lose feeling (have numbness) or feel tingling anywhere on your body.

## 2023-11-28 NOTE — ED Triage Notes (Signed)
 Pt has a dog bite to his left hand from his dog.  Dog is up to date on rabies vaccine and pt had last tetanus shot this year, bleeding controlled

## 2023-11-28 NOTE — ED Notes (Signed)
 D/c paperwork reviewed with pt, including prescriptions and follow up care.  All questions and/or concerns addressed at time of d/c.  No further needs expressed. . Pt verbalized understanding, Ambulatory with significant other to ED exit, NAD.

## 2023-11-28 NOTE — ED Provider Notes (Signed)
 [place] [facility] Provider Note   CSN: [id number] Arrival date & time: 11/28/23  1703     History  Chief Complaint  Patient presents with   Animal Bite    Jack Peterson is a 43 y.o. male with a history of obesity and GERD who presents the ED today after a dog bite.  Patient reports that he was breaking up a fight between 2 dogs at home when his left hand got bit.  Patient's tetanus vaccination is up-to-date.  Dog's rabies vaccination is up-to-date as well.  Patient endorses pain to the lateral left hand.  There is no active bleeding at the time of evaluation.  He is left-hand dominant.  No additional complaints or concerns at this time.    Home Medications Prior to Admission medications   Medication Sig Start Date End Date Taking? Authorizing Provider  amoxicillin -clavulanate (AUGMENTIN) 875-125 MG tablet Take 1 tablet by mouth every 12 (twelve) hours for 7 days. 11/28/23 12/05/23 Yes Sonnie Dusky, PA-C  fluticasone (FLONASE) 50 MCG/ACT nasal spray Place 2 sprays into both nostrils daily as needed for allergies or rhinitis.    [provider]  sildenafil  (REVATIO ) 20 MG tablet Take 1-2 tablets (20-40 mg total) by mouth daily. 11/27/23   Jess Morita, MD  Vitamin D , Ergocalciferol , (DRISDOL ) 1.25 MG (50000 UNIT) CAPS capsule Take 1 capsule (50,000 Units total) by mouth every 7 (seven) days. 10/18/23   Tabori, Katherine E, MD      Allergies    Sherra Dk [fish oil]    Review of Systems   Review of Systems  Skin:  Positive for wound.  All other systems reviewed and are negative.   Physical Exam Updated Vital Signs BP (!) 156/100 (BP Location: Right Wrist)   Pulse (!) 105   Temp 98.6 F (37 C) (Oral)   Resp 17   SpO2 97%  Physical Exam Vitals and nursing note reviewed.  Constitutional:      Appearance: Normal appearance.  HENT:     Head: Normocephalic and atraumatic.     Mouth/Throat:     Mouth: Mucous membranes are  moist.  Eyes:     Conjunctiva/sclera: Conjunctivae normal.     Pupils: Pupils are equal, round, and reactive to light.  Cardiovascular:     Rate and Rhythm: Normal rate and regular rhythm.     Pulses: Normal pulses.     Heart sounds: Normal heart sounds.  Pulmonary:     Effort: Pulmonary effort is normal.     Breath sounds: Normal breath sounds.  Abdominal:     Palpations: Abdomen is soft.     Tenderness: There is no abdominal tenderness.  Musculoskeletal:        General: Tenderness present.     Comments: Tenderness to palpation to 4th and 5th left hand metacarpals with puncture wound at 5th metacarpal at dorsal aspect. Puncture wound at thenar aspect of left palmar hand. No active bleeding. ROM limited second to pain. Sensation intact. Palpable radial pulse. Cap refill < 2 sec.  Skin:    General: Skin is warm and dry.     Findings: No rash.  Neurological:     General: No focal deficit present.     Mental Status: He is alert.  Psychiatric:        Mood and Affect: Mood normal.        Behavior: Behavior normal.    ED Results / Procedures / Treatments   Labs (  all labs ordered are listed, but only abnormal results are displayed) Labs Reviewed - No data to display  EKG None  Radiology No results found.  Procedures Procedures    Medications Ordered in ED Medications  amoxicillin -clavulanate (AUGMENTIN) 875-125 MG per tablet 1 tablet (has no administration in time range)  oxyCODONE -acetaminophen  (PERCOCET/ROXICET) 5-325 MG per tablet 1 tablet (has no administration in time range)    ED Course/ Medical Decision Making/ A&P                                 Medical Decision Making Amount and/or Complexity of Data Reviewed Radiology: ordered.  Risk Prescription drug management.   This patient presents to the ED for concern of animal bite, this involves an extensive number of treatment options, and is a complaint that carries with it a high risk of complications and  morbidity.   Differential diagnosis includes: animal bite with vs without foreign body, etc.   Comorbidities  See HPI above   Additional History  Additional history obtained from prior records   Imaging Studies  I ordered imaging studies including left hand x-ray  I independently visualized and interpreted imaging which showed:  No evidence of fracture or dislocation. No evidence of bony abnormality or arthropathy.  Soft tissue unremarkable. I agree with the radiologist interpretation   Problem List / ED Course / Critical Interventions / Medication Management  Patient was pain with hip when his dog was toxically bit him in the left hand.  He has a puncture wound to the dorsal aspect of the fifth metacarpal as well as the thenar aspect of the palm.  Patient is left-hand dominant.  There is no flap of skin that needs to be sutured closed. Dog's rabies vaccinations up-to-date.  Patient's tetanus vaccination is up-to-date as well. Patient is neurovascularly intact. I ordered medications including: Percocet for pain Augmentin for infection prophylaxis  Reevaluation of the patient after these medicines showed that the patient improved.  Patient's wound was irrigated and then soaked in Betadine.  Wound was then wrapped due to some bleeding and the puncture site at the fifth metatarsal carpal. I have reviewed the patients home medicines and have made adjustments as needed   Social Determinants of Health  Access to healthcare   Test / Admission - Considered  Discussed findings with patient.  All questions were answered. He is hemodynamically stable and safe discharge home. Return precautions given.       Final Clinical Impression(s) / ED Diagnoses Final diagnoses:  Dog bite, initial encounter    Rx / DC Orders ED Discharge Orders          Ordered    amoxicillin -clavulanate (AUGMENTIN) 875-125 MG tablet  Every 12 hours        11/28/23 1732               Sonnie Dusky, PA-C 11/28/23 1814    Hershel Los, MD 11/28/23 1909

## 2023-11-30 ENCOUNTER — Ambulatory Visit

## 2024-01-15 DIAGNOSIS — M4726 Other spondylosis with radiculopathy, lumbar region: Secondary | ICD-10-CM | POA: Diagnosis not present

## 2024-04-09 DIAGNOSIS — J00 Acute nasopharyngitis [common cold]: Secondary | ICD-10-CM | POA: Diagnosis not present

## 2024-10-24 ENCOUNTER — Ambulatory Visit: Admitting: Urology
